# Patient Record
Sex: Male | Born: 1960 | Race: Black or African American | Hispanic: No | Marital: Single | Smoking: Current every day smoker
Health system: Southern US, Community
[De-identification: ages and names within clinical notes are randomized; demographics above are authoritative.]

## PROBLEM LIST (undated history)

## (undated) DIAGNOSIS — E669 Obesity, unspecified: Secondary | ICD-10-CM

## (undated) DIAGNOSIS — F200 Paranoid schizophrenia: Secondary | ICD-10-CM

## (undated) DIAGNOSIS — I639 Cerebral infarction, unspecified: Secondary | ICD-10-CM

## (undated) DIAGNOSIS — I10 Essential (primary) hypertension: Secondary | ICD-10-CM

## (undated) DIAGNOSIS — E66811 Obesity, class 1: Secondary | ICD-10-CM

## (undated) DIAGNOSIS — K5909 Other constipation: Secondary | ICD-10-CM

## (undated) HISTORY — DX: Essential (primary) hypertension: I10

## (undated) HISTORY — DX: Other constipation: K59.09

## (undated) HISTORY — DX: Obesity, class 1: E66.811

## (undated) HISTORY — DX: Obesity, unspecified: E66.9

---

## 2000-05-06 ENCOUNTER — Encounter: Payer: Self-pay | Admitting: Emergency Medicine

## 2000-05-06 ENCOUNTER — Emergency Department (HOSPITAL_COMMUNITY): Admission: EM | Admit: 2000-05-06 | Discharge: 2000-05-06 | Payer: Self-pay | Admitting: Emergency Medicine

## 2012-12-10 ENCOUNTER — Encounter (HOSPITAL_COMMUNITY): Payer: Self-pay | Admitting: Emergency Medicine

## 2012-12-10 ENCOUNTER — Emergency Department (HOSPITAL_COMMUNITY)
Admission: EM | Admit: 2012-12-10 | Discharge: 2012-12-11 | Disposition: A | Discharge status: Home / Self Care | Payer: Medicaid Other | Attending: Emergency Medicine | Admitting: Emergency Medicine

## 2012-12-10 DIAGNOSIS — Z008 Encounter for other general examination: Secondary | ICD-10-CM | POA: Diagnosis present

## 2012-12-10 DIAGNOSIS — Z79899 Other long term (current) drug therapy: Secondary | ICD-10-CM | POA: Diagnosis not present

## 2012-12-10 DIAGNOSIS — F17200 Nicotine dependence, unspecified, uncomplicated: Secondary | ICD-10-CM | POA: Insufficient documentation

## 2012-12-10 DIAGNOSIS — F29 Unspecified psychosis not due to a substance or known physiological condition: Secondary | ICD-10-CM | POA: Insufficient documentation

## 2012-12-10 DIAGNOSIS — F200 Paranoid schizophrenia: Secondary | ICD-10-CM | POA: Insufficient documentation

## 2012-12-10 HISTORY — DX: Paranoid schizophrenia: F20.0

## 2012-12-10 LAB — CBC
HCT: 38.9 % — ABNORMAL LOW (ref 39.0–52.0)
Hemoglobin: 12.8 g/dL — ABNORMAL LOW (ref 13.0–17.0)
MCH: 30.3 pg (ref 26.0–34.0)
MCV: 92.2 fL (ref 78.0–100.0)
RBC: 4.22 MIL/uL (ref 4.22–5.81)

## 2012-12-10 LAB — COMPREHENSIVE METABOLIC PANEL
ALT: 15 U/L (ref 0–53)
CO2: 26 mEq/L (ref 19–32)
Calcium: 8.7 mg/dL (ref 8.4–10.5)
Creatinine, Ser: 0.67 mg/dL (ref 0.50–1.35)
GFR calc Af Amer: >90 mL/min (ref >90)
GFR calc non Af Amer: >90 mL/min (ref >90)
Glucose, Bld: 106 mg/dL — ABNORMAL HIGH (ref 70–99)

## 2012-12-10 LAB — RAPID URINE DRUG SCREEN, HOSP PERFORMED
Opiates: NOT DETECTED
Tetrahydrocannabinol: NOT DETECTED

## 2012-12-10 MED ORDER — LORAZEPAM 1 MG PO TABS: 1.0000 mg | ORAL_TABLET | Freq: Three times a day (TID) | ORAL | Status: DC | PRN

## 2012-12-10 MED ORDER — ONDANSETRON HCL 4 MG PO TABS: 4.0000 mg | ORAL_TABLET | Freq: Three times a day (TID) | ORAL | Status: DC | PRN

## 2012-12-10 MED ORDER — ACETAMINOPHEN 325 MG PO TABS
650.0000 mg | ORAL_TABLET | ORAL | Status: DC | PRN
  Administered 2012-12-10: 650 mg via ORAL
  Filled 2012-12-10: qty 2

## 2012-12-10 MED ORDER — NICOTINE 21 MG/24HR TD PT24: 21.0000 mg | MEDICATED_PATCH | Freq: Every day | TRANSDERMAL | Status: DC | PRN

## 2012-12-10 MED ORDER — ALUM & MAG HYDROXIDE-SIMETH 200-200-20 MG/5ML PO SUSP: 30.0000 mL | ORAL | Status: DC | PRN

## 2012-12-10 NOTE — ED Notes (Signed)
Patient searched by security.

## 2012-12-10 NOTE — ED Notes (Signed)
Erling Conte sister [phone]; Kailen Hinkle mother [phone]

## 2012-12-10 NOTE — ED Provider Notes (Signed)
History     CSN: [id number]  Arrival date & time 12/10/12  1729   First MD Initiated Contact with Patient 12/10/12 1735      Chief Complaint  Patient presents with  . Medical Clearance    The history is provided by the police and medical records. The history is limited by the condition of the patient (Hx schizophrenia).  Pt was seen at 1755.  Per Police and [employer] report, pt with hx paranoid schizophrenia.  Pt brought to ED today under IVC by his family for increasing aggression and non-compliant with medications.     Past Medical History  Diagnosis Date  . Paranoid schizophrenia     History reviewed. No pertinent past surgical history.    History  Substance Use Topics  . Smoking status: Current Every Day Smoker    Types: Cigarettes  . Smokeless tobacco: Not on file  . Alcohol Use: No      Review of Systems  Unable to perform ROS: Psychiatric disorder    Allergies  Review of patient's allergies indicates no known allergies.  Home Medications   Current Outpatient Rx  Name  Route  Sig  Dispense  Refill  . acetaminophen (TYLENOL) 325 MG tablet   Oral   Take 650 mg by mouth every 6 (six) hours as needed for pain (pain).         . ARIPiprazole (ABILIFY) 10 MG tablet   Oral   Take 10 mg by mouth at bedtime.           BP 146/86  Pulse 103  Temp(Src) 98.2 F (36.8 C) (Oral)  Resp 16  SpO2 98%  Physical Exam 1800: Physical examination:  Nursing notes reviewed; Vital signs and O2 SAT reviewed;  Constitutional: Well developed, Well nourished, Well hydrated, In no acute distress; Head:  Normocephalic, atraumatic; Eyes: EOMI, PERRL, No scleral icterus; ENMT: Mouth and pharynx normal, Mucous membranes moist; Neck: Supple, Full range of motion, No lymphadenopathy; Cardiovascular: Regular rate and rhythm, No gallop; Respiratory: Breath sounds clear & equal bilaterally, No wheezes.  Speaking full sentences with ease, Normal respiratory effort/excursion; Chest:  Nontender, Movement normal; Abdomen: Soft, Nontender, Nondistended, Normal bowel sounds;; Extremities: Pulses normal, No tenderness, No edema, No calf edema or asymmetry.; Neuro: Awake, alert, mumbling speech. Moves ext spontaneously on stretcher; Skin: Color normal, Warm, Dry.; Psych:  Affect flat, poor eye contact, +psychosis.     ED Course  Procedures    MDM  MDM Reviewed: previous chart, vitals and nursing note Interpretation: labs     Results for orders placed during the hospital encounter of 12/10/12  ACETAMINOPHEN LEVEL      Result Value Range   Acetaminophen (Tylenol), Serum <15.0  10 - 30 ug/mL  CBC      Result Value Range   WBC 10.0  4.0 - 10.5 K/uL   RBC 4.22  4.22 - 5.81 MIL/uL   Hemoglobin 12.8 (*) 13.0 - 17.0 g/dL   HCT 78.2 (*) 95.6 - 21.3 %   MCV 92.2  78.0 - 100.0 fL   MCH 30.3  26.0 - 34.0 pg   MCHC 32.9  30.0 - 36.0 g/dL   RDW 08.6  57.8 - 46.9 %   Platelets 271  150 - 400 K/uL  COMPREHENSIVE METABOLIC PANEL      Result Value Range   Sodium 136  135 - 145 mEq/L   Potassium 4.0  3.5 - 5.1 mEq/L   Chloride 101  96 - 112 mEq/L  CO2 26  19 - 32 mEq/L   Glucose, Bld 106 (*) 70 - 99 mg/dL   BUN 8  6 - 23 mg/dL   Creatinine, Ser 4.54  0.50 - 1.35 mg/dL   Calcium 8.7  8.4 - 09.8 mg/dL   Total Protein 6.8  6.0 - 8.3 g/dL   Albumin 3.3 (*) 3.5 - 5.2 g/dL   AST 16  0 - 37 U/L   ALT 15  0 - 53 U/L   Alkaline Phosphatase 137 (*) 39 - 117 U/L   Total Bilirubin 0.3  0.3 - 1.2 mg/dL   GFR calc non Af Amer >90  >90 mL/min   GFR calc Af Amer >90  >90 mL/min  ETHANOL      Result Value Range   Alcohol, Ethyl (B) <11  0 - 11 mg/dL  SALICYLATE LEVEL      Result Value Range   Salicylate Lvl <2.0 (*) 2.8 - 20.0 mg/dL  URINE RAPID DRUG SCREEN (HOSP PERFORMED)      Result Value Range   Opiates NONE DETECTED  NONE DETECTED   Cocaine NONE DETECTED  NONE DETECTED   Benzodiazepines NONE DETECTED  NONE DETECTED   Amphetamines NONE DETECTED  NONE DETECTED    Tetrahydrocannabinol NONE DETECTED  NONE DETECTED   Barbiturates NONE DETECTED  NONE DETECTED     2215:  Pending Telepsych eval.         Laray Anger, DO 12/10/12 2220

## 2012-12-10 NOTE — ED Notes (Signed)
Patient sent from [facility]- h/o schizophrenia that is non compliant with meds and verbally aggressive with sister.

## 2012-12-11 DIAGNOSIS — F200 Paranoid schizophrenia: Secondary | ICD-10-CM

## 2012-12-11 MED ORDER — ARIPIPRAZOLE 9.75 MG/1.3ML IM SOLN
9.7500 mg | Freq: Once | INTRAMUSCULAR | Status: AC
  Administered 2012-12-11: 9.75 mg via INTRAMUSCULAR
  Filled 2012-12-11: qty 1.3

## 2012-12-11 NOTE — Progress Notes (Signed)
CSW left message with Erling Conte who is pt sister. Per act team, pt sister is pt guardian.   Doree Albee  [phone] 12/11/2012 11:27am

## 2012-12-11 NOTE — ED Notes (Signed)
Barbara Cower and [place] at bedside in attempt to walk pt. Pt unable to walk at this time. States that he usually uses a wheelchair to get around. Reports that he has nerve damage.

## 2012-12-11 NOTE — Consult Note (Signed)
Reason for Consult: Paranoid schizophrenia noncompliant with medications and threatening his sister Referring Physician: Dr. Carney Corners is an 52 y.o. male.  HPI: The patient was brought in to the best the long emergency department by [employer] with involuntary commitment filed by [facility] for psychiatric evaluation as patient has increased agitation noncompliant with medication. Patient reported he has spasms and pains on his neck which making him upset. Patient reported he like to take medication which was prescribed in the prison with yeast was stated 2005 to February of this year for violation of probation and legal charges. Patient denied symptoms of depression, anxiety, Mania and psychosis. Patient denied suicidal and homicidal ideation intentions or plans.  MSE: Patient was found sitting on his bed, calm, quite and cooperative. Patient has no abnormal psychomotor activity. He states his mood is fine his affect was constricted he has normal speech but difficult to follow because of lost upper teeth. Patient has a linear and goal-directed thought process he does not appear to be interacting with internal stimuli. He has fair to poor insight judgment and impulse components.  Past Medical History  Diagnosis Date  . Paranoid schizophrenia     History reviewed. No pertinent past surgical history.  History reviewed. No pertinent family history.  Social History:  reports that he has been smoking Cigarettes.  He has been smoking about 0.00 packs per day. He does not have any smokeless tobacco history on file. He reports that he does not drink alcohol or use illicit drugs.  Allergies: No Known Allergies  Medications: I have reviewed the patient's current medications.  Results for orders placed during the hospital encounter of 12/10/12 (from the past 48 hour(s))  ACETAMINOPHEN LEVEL     Status: None   Collection Time    12/10/12  5:57 PM      Result Value Range   Acetaminophen (Tylenol), Serum <15.0  10 - 30 ug/mL   Comment:            THERAPEUTIC CONCENTRATIONS VARY     SIGNIFICANTLY. A RANGE OF 10-30     ug/mL MAY BE AN EFFECTIVE     CONCENTRATION FOR MANY PATIENTS.     HOWEVER, SOME ARE BEST TREATED     AT CONCENTRATIONS OUTSIDE THIS     RANGE.     ACETAMINOPHEN CONCENTRATIONS     >150 ug/mL AT 4 HOURS AFTER     INGESTION AND >50 ug/mL AT 12     HOURS AFTER INGESTION ARE     OFTEN ASSOCIATED WITH TOXIC     REACTIONS.  CBC     Status: Abnormal   Collection Time    12/10/12  5:57 PM      Result Value Range   WBC 10.0  4.0 - 10.5 K/uL   RBC 4.22  4.22 - 5.81 MIL/uL   Hemoglobin 12.8 (*) 13.0 - 17.0 g/dL   HCT 46.9 (*) 62.9 - 52.8 %   MCV 92.2  78.0 - 100.0 fL   MCH 30.3  26.0 - 34.0 pg   MCHC 32.9  30.0 - 36.0 g/dL   RDW 41.3  24.4 - 01.0 %   Platelets 271  150 - 400 K/uL  COMPREHENSIVE METABOLIC PANEL     Status: Abnormal   Collection Time    12/10/12  5:57 PM      Result Value Range   Sodium 136  135 - 145 mEq/L   Potassium 4.0  3.5 - 5.1 mEq/L  Chloride 101  96 - 112 mEq/L   CO2 26  19 - 32 mEq/L   Glucose, Bld 106 (*) 70 - 99 mg/dL   BUN 8  6 - 23 mg/dL   Creatinine, Ser 4.09  0.50 - 1.35 mg/dL   Calcium 8.7  8.4 - 81.1 mg/dL   Total Protein 6.8  6.0 - 8.3 g/dL   Albumin 3.3 (*) 3.5 - 5.2 g/dL   AST 16  0 - 37 U/L   ALT 15  0 - 53 U/L   Alkaline Phosphatase 137 (*) 39 - 117 U/L   Total Bilirubin 0.3  0.3 - 1.2 mg/dL   GFR calc non Af Amer >90  >90 mL/min   GFR calc Af Amer >90  >90 mL/min   Comment:            The eGFR has been calculated     using the CKD EPI equation.     This calculation has not been     validated in all clinical     situations.     eGFR's persistently     <90 mL/min signify     possible Chronic Kidney Disease.  ETHANOL     Status: None   Collection Time    12/10/12  5:57 PM      Result Value Range   Alcohol, Ethyl (B) <11  0 - 11 mg/dL   Comment:            LOWEST DETECTABLE LIMIT FOR      SERUM ALCOHOL IS 11 mg/dL     FOR MEDICAL PURPOSES ONLY  SALICYLATE LEVEL     Status: Abnormal   Collection Time    12/10/12  5:57 PM      Result Value Range   Salicylate Lvl <2.0 (*) 2.8 - 20.0 mg/dL  URINE RAPID DRUG SCREEN (HOSP PERFORMED)     Status: None   Collection Time    12/10/12  6:48 PM      Result Value Range   Opiates NONE DETECTED  NONE DETECTED   Cocaine NONE DETECTED  NONE DETECTED   Benzodiazepines NONE DETECTED  NONE DETECTED   Amphetamines NONE DETECTED  NONE DETECTED   Tetrahydrocannabinol NONE DETECTED  NONE DETECTED   Barbiturates NONE DETECTED  NONE DETECTED   Comment:            DRUG SCREEN FOR MEDICAL PURPOSES     ONLY.  IF CONFIRMATION IS NEEDED     FOR ANY PURPOSE, NOTIFY LAB     WITHIN 5 DAYS.                LOWEST DETECTABLE LIMITS     FOR URINE DRUG SCREEN     Drug Class       Cutoff (ng/mL)     Amphetamine      1000     Barbiturate      200     Benzodiazepine   200     Tricyclics       300     Opiates          300     Cocaine          300     THC              50    No results found.  Positive for aggressive behavior, bad mood, behavior problems and mood swings Blood pressure 144/87, pulse 62, temperature 98.8 F (37.1 C), temperature  source Oral, resp. rate 16, SpO2 97.00%.   Assessment/Plan: Paranoid schizophrenia, chronic Partially compliant with medication Antisocial personality disorder by history  Recommendation: Patient will be referred to the outpatient psychiatric services at [facility] behavioral. No medication changes made during this visit.  Jayce Kainz,JANARDHAHA R. 12/11/2012, 2:04 PM

## 2012-12-11 NOTE — ED Notes (Signed)
Psych MD has seen and spoken with the pt since his tx to TCU.

## 2012-12-11 NOTE — BH Assessment (Signed)
[facility] Assessment Progress Note   Patient to be discharged home per recommendations of Dr. Elsie Saas. Writer met with patients sister aka caregiver. She was agreeable to patients discharge, however; wanted to insure that patients scheduled medications were given. Writer discussed sisters concerns with Dr. Elsie Saas. He will order the appropriate medications for patient which will be administered prior to patients discharge from [facility]. Patiens sister made aware of the discharge plan and is agreeable to taking patient home with her once officially discharged.

## 2012-12-11 NOTE — Progress Notes (Signed)
ED CM consulted for medication assistance for abilify. CM reviewed EPIC notes and chart review information CM spoke with the pt and sister, cheryl about [facility] MATCH program ($3 co pay for each Rx through [facility] program and choice of pharmacies) Pt agreed to receive assistance from program   CM spoke with pt who confirms self pay [facility] resident with no pcp.  Pt voiced understanding and appreciation of resources provided  Pt is eligible for [facility] MATCH program. PDMI information entered. MATCH letter completed and provided to pt. CM updated EDP and ED RN

## 2012-12-11 NOTE — Progress Notes (Signed)
[facility] ED CM consulted by ED SW. Need for contact with sister to inquire about medical information for 5 years in jail, why pt w/c bound and list of medications.  [name] contacted at [phone] and she informed CM she has copies of pt medical history and will bring them to [facility] ED within 30 minutes.  She reports pt was at [facility] state prison and her mother was contacted during that time about the pt having cervical spondylosis and degenerative spinal disorder with the need for surgery consisting of "removing a bone out of his knee" The refused to have the surgery and therefore due to his back issues he became w/c bound.  ED CM updated TCU RN so that when sister arrives the pmh information from jail could be copied and entered in pt [facility] medical history for filing
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
## 2012-12-11 NOTE — Progress Notes (Signed)
Name: Jared Preston, Jared Preston [facility][id number] Bin: [id number] RX Group: [id number] PCN: [id number]  Discharge Date: December 11, 2012 Expiration Date: December 18, 2012 (must be filled within 7 days of discharge   Dear ______David L Tate____________:  Bonita Quin have been approved to have your discharge prescriptions filled through our [facility] (Medication Assistance Through [facility]) program. This program allows for a one-time (no refills) 34-day supply of selected medications for a low copay amount.  The copay is $3.00 per prescription. For instance, if you have one prescription, you will pay $3.00; for two prescriptions, you pay $6.00; for three prescriptions, you pay $9.00; and so on.  Only certain pharmacies are participating in this program with [facility]. You will need to select one of the pharmacies from the attached list and take your prescriptions, this letter, and your photo ID to one of the participating pharmacies.   We are excited that you are able to use the [facility] program to get your medications. These prescriptions must be filled within 7 days of hospital discharge or they will no longer be valid for the [facility] program. Should you have any problems with your prescriptions please contact your case management team member at [phone].  Thank you,   [facility]   Participating [facility]  [facility]   [facility] [address] [place], [place]   [facility] [address] [place], [place]   [facility] [address] [place], [place]   [facility]   [address] [place], [place]   [address] [place], [place], [place]   [address] [place], [place]   [address], [place], [place]   [address], [place], [place]   [address], [place], [place]   [address] [place], [place]   [address] [place], [place]   [address], [place], [place]   [address] [place], [place], [place]  [facility]   [address], [place], [place]   [address] [place], [place]   [address] [place], [place], [place]   [address], [place], [place]   [address], [place], [place]   [address] [place] [address] [place], [place], [place] [facility]   [address] [place], [place]   [address], [place], [place]   [address], [place], [place]   [address], [place], [place]   [address], [place], [place]   [address] [place], [place]   [address], [place], [place]   [address], [place], [place]   [address], [place], [place]   [address], [place], [place]   [address], [place], [place]   [address], [place], [place]   [address] [place], [place]   [address] [place], [place], [place]  Independent Pharmacies   [facility] [address] [place], [address] [place], [place]   [facility] [address] [place], [place]   [facility] [address] [place], [place]   For continued medication needs, please contact the [facility] Department at  [phone].

## 2012-12-11 NOTE — ED Provider Notes (Signed)
Telepsych Dr. Jacky Kindle has evaluated pt:  States pt is grossly psychotic, noncompliant with medications, and needs constant redirection throughout evaluation; he is displaying poor judgment and insight; recommends psych admit for stabilization at this time.    Laray Anger, DO 12/11/12 [phone]

## 2012-12-11 NOTE — [facility] Suicide Risk Assessment (Signed)
Suicide Risk Assessment  Discharge Assessment     Demographic Factors:  Male, Adolescent or young adult and Low socioeconomic status  Mental Status Per Nursing Assessment::   On Admission:     Current Mental Status by Physician: NA  Loss Factors: Decline in physical health and Financial problems/change in socioeconomic status  Historical Factors: Impulsivity  Risk Reduction Factors:   Sense of responsibility to family, Religious beliefs about death, Living with another person, especially a relative, Positive social support and Positive therapeutic relationship  Continued Clinical Symptoms:  Severe Anxiety and/or Agitation Schizophrenia:   Paranoid or undifferentiated type Previous Psychiatric Diagnoses and Treatments Medical Diagnoses and Treatments/Surgeries  Cognitive Features That Contribute To Risk:  Polarized thinking    Suicide Risk:  Minimal: No identifiable suicidal ideation.  Patients presenting with no risk factors but with morbid ruminations; may be classified as minimal risk based on the severity of the depressive symptoms  Discharge Diagnoses:   AXIS I:  Schizoaffective Disorder AXIS II:  Antisocial Personality Disorder AXIS III:   Past Medical History  Diagnosis Date  . Paranoid schizophrenia    AXIS IV:  economic problems, educational problems, problems related to legal system/crime and problems with access to health care services AXIS V:  41-50 serious symptoms  Plan Of Care/Follow-up recommendations:  Activity:  as tolerated Diet:  regular  Is patient on multiple antipsychotic therapies at discharge:  No   Has Patient had three or more failed trials of antipsychotic monotherapy by history:  No  Recommended Plan for Multiple Antipsychotic Therapies: Not applicable  Carlethia Mesquita,JANARDHAHA R. 12/11/2012, 2:13 PM

## 2012-12-11 NOTE — Progress Notes (Signed)
[facility] ED CM reviewed forms brought in by sister, cheryl that are personal care services form dated 10/17/12 from dr mark p.cheltenham[facility] psychiatrist [phone] fax [phone]. The form indicated pt's d/c date from prison was on 10/21/12. [name] states pt "sat in wheelchair in prison a lot with his leg crossed"    PMH listed on this form are: paranoid schizphrenia, antisocial personality disorder, tardive dyskinesia, cervical radiculopathy, cervical spondylosis.  [name] reports pt was only on abilify 10 mg and cogentin in prison but was seen by [facility] on 12/10/12 to get a shot of abilify and was informed pt did not need to be on cogentin any longer. [name] reports pt has not had any therapy at all and she believes he may benefit from therapy   ED psychiatrist update and allowed to review forms prior to placing it in his medical record for filing. Notified him that cheryl sister is available in pt's room for any further questions. Psychiatrist notified of Cheryl's inquiry about pt receiving a shot of abilify (due to non compliance with taking it po).   [name] inquired about a home ramp for pt [name] reports pt has [facility] providing services since last week.  [name] was quoted $1200 Reports pt is connected an adult day care service called [employer],[address]. [address] [place], [place] [address] [phone] Office [phone] Fax and pcp is at [facility] community & homeless & walk-ins) [address], [place], [place] [address] Phone: [phone] Fax: [phone] EPIC updated  1400 CM spoke with Baxter Hire, home health coordinator of [facility] [phone] to discussed assistance with handicap ramp Baxter Hire will check and return a call to CM.   CM left a voice message for Lucretia DME coordinator of [facility] [phone]) about handicap ramp Spoke with Darral Dash who will check with updated  services offered and return a call to CM received a call back from [place] stating that [facility] does assist with handicap ramp at the following size and costs:  5 foot ramp that covers up to 2 steps at $395 7 foot ramp that covers 2 or more steps at $540 or A charge of $45 a month for rental  The pt/family needs to go to the [facility] [address] [place], [place], [address].  [phone] office to obtain ramps    This information provided to sister cheryl in written form at 1420

## 2012-12-11 NOTE — ED Provider Notes (Signed)
Filed Vitals:   12/11/12 0550  BP: 133/81  Pulse: 61  Temp: 98.6 F (37 C)  Resp: 18   Pt seen this morning. Sleeping but awakens to voice. Mildly agitated but following commands. Pending placement per psych recommendations.   Raeford Razor, MD 12/11/12 [phone]

## 2013-02-13 ENCOUNTER — Ambulatory Visit: Payer: Medicaid Other | Admitting: Physical Therapy

## 2014-03-31 ENCOUNTER — Encounter (HOSPITAL_COMMUNITY): Payer: Self-pay | Admitting: Emergency Medicine

## 2014-03-31 ENCOUNTER — Emergency Department (HOSPITAL_COMMUNITY): Payer: Medicaid Other

## 2014-03-31 ENCOUNTER — Emergency Department (HOSPITAL_COMMUNITY)
Admission: EM | Admit: 2014-03-31 | Discharge: 2014-03-31 | Disposition: A | Discharge status: Home / Self Care | Payer: Medicaid Other | Attending: Emergency Medicine | Admitting: Emergency Medicine

## 2014-03-31 DIAGNOSIS — K5900 Constipation, unspecified: Secondary | ICD-10-CM | POA: Diagnosis not present

## 2014-03-31 DIAGNOSIS — F17200 Nicotine dependence, unspecified, uncomplicated: Secondary | ICD-10-CM | POA: Insufficient documentation

## 2014-03-31 DIAGNOSIS — K089 Disorder of teeth and supporting structures, unspecified: Secondary | ICD-10-CM | POA: Diagnosis not present

## 2014-03-31 DIAGNOSIS — E669 Obesity, unspecified: Secondary | ICD-10-CM | POA: Diagnosis not present

## 2014-03-31 DIAGNOSIS — Z8659 Personal history of other mental and behavioral disorders: Secondary | ICD-10-CM | POA: Diagnosis not present

## 2014-03-31 DIAGNOSIS — Z8673 Personal history of transient ischemic attack (TIA), and cerebral infarction without residual deficits: Secondary | ICD-10-CM | POA: Diagnosis not present

## 2014-03-31 DIAGNOSIS — K0889 Other specified disorders of teeth and supporting structures: Secondary | ICD-10-CM

## 2014-03-31 HISTORY — DX: Cerebral infarction, unspecified: I63.9

## 2014-03-31 LAB — URINALYSIS, ROUTINE W REFLEX MICROSCOPIC
BILIRUBIN URINE: NEGATIVE
GLUCOSE, UA: NEGATIVE mg/dL
Hgb urine dipstick: NEGATIVE
KETONES UR: NEGATIVE mg/dL
Nitrite: NEGATIVE
PH: 7.5 (ref 5.0–8.0)
PROTEIN: NEGATIVE mg/dL
Specific Gravity, Urine: 1.009 (ref 1.005–1.030)
Urobilinogen, UA: 1 mg/dL (ref 0.0–1.0)

## 2014-03-31 LAB — URINE MICROSCOPIC-ADD ON

## 2014-03-31 MED ORDER — OXYCODONE-ACETAMINOPHEN 5-325 MG PO TABS: 2.0000 | ORAL_TABLET | Freq: Four times a day (QID) | ORAL | Status: DC | PRN

## 2014-03-31 MED ORDER — PENICILLIN V POTASSIUM 500 MG PO TABS: 500.0000 mg | ORAL_TABLET | Freq: Four times a day (QID) | ORAL | Status: AC

## 2014-03-31 NOTE — Discharge Instructions (Signed)
Constipation °Constipation is when a person has fewer than three bowel movements a week, has difficulty having a bowel movement, or has stools that are dry, hard, or larger than normal. As people grow older, constipation is more common. If you try to fix constipation with medicines that make you have a bowel movement (laxatives), the problem may get worse. Long-term laxative use may cause the muscles of the colon to become weak. A low-fiber diet, not taking in enough fluids, and taking certain medicines may make constipation worse.  °CAUSES  °· Certain medicines, such as antidepressants, pain medicine, iron supplements, antacids, and water pills.   °· Certain diseases, such as diabetes, irritable bowel syndrome (IBS), thyroid disease, or depression.   °· Not drinking enough water.   °· Not eating enough fiber-rich foods.   °· Stress or travel.   °· Lack of physical activity or exercise.   °· Ignoring the urge to have a bowel movement.   °· Using laxatives too much.   °SIGNS AND SYMPTOMS  °· Having fewer than three bowel movements a week.   °· Straining to have a bowel movement.   °· Having stools that are hard, dry, or larger than normal.   °· Feeling full or bloated.   °· Pain in the lower abdomen.   °· Not feeling relief after having a bowel movement.   °DIAGNOSIS  °Your health care provider will take a medical history and perform a physical exam. Further testing may be done for severe constipation. Some tests may include: °· A barium enema X-ray to examine your rectum, colon, and, sometimes, your small intestine.   °· A sigmoidoscopy to examine your lower colon.   °· A colonoscopy to examine your entire colon. °TREATMENT  °Treatment will depend on the severity of your constipation and what is causing it. Some dietary treatments include drinking more fluids and eating more fiber-rich foods. Lifestyle treatments may include regular exercise. If these diet and lifestyle recommendations do not help, your health care  provider may recommend taking over-the-counter laxative medicines to help you have bowel movements. Prescription medicines may be prescribed if over-the-counter medicines do not work.  °HOME CARE INSTRUCTIONS  °· Eat foods that have a lot of fiber, such as fruits, vegetables, whole grains, and beans. °· Limit foods high in fat and processed sugars, such as french fries, hamburgers, cookies, candies, and soda.   °· A fiber supplement may be added to your diet if you cannot get enough fiber from foods.   °· Drink enough fluids to keep your urine clear or pale yellow.   °· Exercise regularly or as directed by your health care provider.   °· Go to the restroom when you have the urge to go. Do not hold it.   °· Only take over-the-counter or prescription medicines as directed by your health care provider. Do not take other medicines for constipation without talking to your health care provider first.   °SEEK IMMEDIATE MEDICAL CARE IF:  °· You have bright red blood in your stool.   °· Your constipation lasts for more than 4 days or gets worse.   °· You have abdominal or rectal pain.   °· You have thin, pencil-like stools.   °· You have unexplained weight loss. °MAKE SURE YOU:  °· Understand these instructions. °· Will watch your condition. °· Will get help right away if you are not doing well or get worse. °Document Released: 05/13/2004 Document Revised: 08/20/2013 Document Reviewed: 05/27/2013 °ExitCare® Patient Information ©2015 [employer]. This information is not intended to replace advice given to you by your health care provider. Make sure you discuss any questions   you have with your health care provider.   Start using Dulcolax tablets twice daily as well as 17 g of MiraLAX powder in a glass full of water twice daily as well. He may also purchase an over-the-counter enema to use.

## 2014-03-31 NOTE — ED Notes (Signed)
Pt here for constipation x 5 days, pt denies any other symptoms,deniespain,

## 2014-03-31 NOTE — ED Notes (Signed)
Erling Conteheryl Ruiter, [phone]

## 2014-03-31 NOTE — ED Provider Notes (Signed)
CSN: [id number]     Arrival date & time 03/31/14  1033 History   First MD Initiated Contact with Patient 03/31/14 1121     Chief Complaint  Patient presents with  . Constipation     (Consider location/radiation/quality/duration/timing/severity/associated sxs/prior Treatment) Patient is a 53 y.o. male presenting with constipation. The history is provided by the patient.  Constipation Severity:  Mild Time since last bowel movement:  10 days Timing:  Constant Progression:  Unchanged Chronicity:  Recurrent Context comment:  Spontaneous Stool description:  None produced Unusual stool frequency:  Irregular Relieved by:  Nothing Worsened by:  Nothing tried Ineffective treatments: senokot. Associated symptoms: no abdominal pain, no diarrhea, no dysuria, no fever, no nausea and no vomiting   Risk factors: obesity   Risk factors: no hx of abdominal surgery     Past Medical History  Diagnosis Date  . Paranoid schizophrenia   . Stroke    History reviewed. No pertinent past surgical history. History reviewed. No pertinent family history. History  Substance Use Topics  . Smoking status: Current Every Day Smoker    Types: Cigarettes  . Smokeless tobacco: Not on file  . Alcohol Use: No    Review of Systems  Constitutional: Negative for fever.  HENT: Positive for dental problem. Negative for drooling and rhinorrhea.   Eyes: Negative for pain.  Respiratory: Negative for cough and shortness of breath.   Cardiovascular: Negative for chest pain and leg swelling.  Gastrointestinal: Positive for constipation. Negative for nausea, vomiting, abdominal pain and diarrhea.  Genitourinary: Negative for dysuria and hematuria.  Musculoskeletal: Negative for gait problem and neck pain.  Skin: Negative for color change.  Neurological: Negative for numbness and headaches.  Hematological: Negative for adenopathy.  Psychiatric/Behavioral: Negative for behavioral problems.  All other systems  reviewed and are negative.     Allergies  Review of patient's allergies indicates no known allergies.  Home Medications   Prior to Admission medications   Medication Sig Start Date End Date Taking? Authorizing Provider  acetaminophen (TYLENOL) 325 MG tablet Take 650 mg by mouth every 6 (six) hours as needed for pain (pain).    Historical Provider, MD   BP 145/87  Pulse 73  Temp(Src) 98 F (36.7 C) (Oral)  Resp 18  Ht 6\' 2"  (1.88 m)  Wt 268 lb (121.564 kg)  BMI 34.39 kg/m2  SpO2 98% Physical Exam  Nursing note and vitals reviewed. Constitutional: He is oriented to person, place, and time. He appears well-developed and well-nourished.  HENT:  Head: Normocephalic and atraumatic.  Right Ear: External ear normal.  Left Ear: External ear normal.  Nose: Nose normal.  Mouth/Throat: Oropharynx is clear and moist. No oropharyngeal exudate.  Diffuse poor dentition with multiple old fractured teeth. He has tenderness to palpation of a left upper posterior molar.  No trismus is noted.  No intraoral abscess is noted.  Normal appearing posterior oropharynx.  Mild swelling to the left cheek.  Eyes: Conjunctivae and EOM are normal. Pupils are equal, round, and reactive to light.  Neck: Normal range of motion. Neck supple.  Cardiovascular: Normal rate, regular rhythm, normal heart sounds and intact distal pulses.  Exam reveals no gallop and no friction rub.   No murmur heard. Pulmonary/Chest: Effort normal and breath sounds normal. No respiratory distress. He has no wheezes.  Abdominal: Soft. Bowel sounds are normal. He exhibits no distension. There is no tenderness. There is no rebound and no guarding.  Musculoskeletal: Normal range of motion. He exhibits  no edema and no tenderness.  Neurological: He is alert and oriented to person, place, and time.  Skin: Skin is warm and dry.  Psychiatric: He has a normal mood and affect. His behavior is normal.    ED Course  Procedures  (including critical care time) Labs Review Labs Reviewed  URINALYSIS, ROUTINE W REFLEX MICROSCOPIC - Abnormal; Notable for the following:    APPearance HAZY (*)    Leukocytes, UA TRACE (*)    All other components within normal limits  URINE MICROSCOPIC-ADD ON - Abnormal; Notable for the following:    Squamous Epithelial / LPF FEW (*)    Bacteria, UA FEW (*)    All other components within normal limits    Imaging Review Dg Abd 2 Views  03/31/2014   CLINICAL DATA:  Constipation.  Abdominal pain.  EXAM: ABDOMEN - 2 VIEW  COMPARISON:  None.  FINDINGS: Stool left colon and rectosigmoid region. Gas and stool-filled right colon. Gas-filled prominent size small bowel loops right lower quadrant.  No free intraperitoneal air.  Degenerative changes L4-5 and left hip joint.  IMPRESSION: Nonspecific abnormal bowel gas pattern as detailed above.   Electronically Signed   By: Bridgett LarssonSteve  Olson M.D.   On: 03/31/2014 12:32     EKG Interpretation None      MDM   Final diagnoses:  Constipation, unspecified constipation type  Pain, dental    11:49 AM 53 y.o. male who presents with constipation for 10 days. He notes that he has a history of constipation and takes Senokot. He denies any abdominal pain, fevers, vomiting. He states that he also started a new medication last Wednesday but only took it for 2 days and is not sure what this medication is. He states that he developed some left-sided facial swelling and left upper dental pain 4 days ago. He has diffuse dental caries and multiple fractured teeth. No obvious intraoral abscess is noted. He is afebrile and vital signs are unremarkable here. Will get screening plain film of abdomen.  1:59 PM: Will give pain control/abx for dental pain. Will rec miralax bid and senokot bid as well as otc enema for constipation.  I have discussed the diagnosis/risks/treatment options with the patient and believe the pt to be eligible for discharge home to follow-up with his  dentist asap. We also discussed returning to the ED immediately if new or worsening sx occur. We discussed the sx which are most concerning (e.g., abd pain, fever, worsening facial swelling) that necessitate immediate return. Medications administered to the patient during their visit and any new prescriptions provided to the patient are listed below.  Medications given during this visit Medications - No data to display  Discharge Medication List as of 03/31/2014  2:01 PM    START taking these medications   Details  oxyCODONE-acetaminophen (PERCOCET) 5-325 MG per tablet Take 2 tablets by mouth every 6 (six) hours as needed for moderate pain., Starting 03/31/2014, Until Discontinued, Print    penicillin v potassium (VEETID) 500 MG tablet Take 1 tablet (500 mg total) by mouth 4 (four) times daily., Starting 03/31/2014, Last dose on Mon 04/07/14, Print         Junius ArgyleForrest S Sahan Pen, MD 04/01/14 1321

## 2014-09-01 ENCOUNTER — Encounter: Payer: Medicaid Other | Attending: Internal Medicine | Admitting: Dietician

## 2014-09-01 ENCOUNTER — Encounter: Payer: Self-pay | Admitting: Dietician

## 2014-09-01 VITALS — Ht 74.0 in

## 2014-09-01 DIAGNOSIS — E669 Obesity, unspecified: Secondary | ICD-10-CM | POA: Diagnosis present

## 2014-09-01 DIAGNOSIS — Z6834 Body mass index (BMI) 34.0-34.9, adult: Secondary | ICD-10-CM | POA: Diagnosis not present

## 2014-09-01 DIAGNOSIS — Z713 Dietary counseling and surveillance: Secondary | ICD-10-CM | POA: Insufficient documentation

## 2014-09-01 NOTE — Progress Notes (Signed)
  Medical Nutrition Therapy:  Appt start time: 1530 end time:  1645.   Assessment:  Primary concerns today:  Obesity and constipation.  Patient accompanied by sister.  Patient lives with sister.  She does the shopping and the cooking.  Patient is in a wheel chair since CVA.  Sister reports that patient has schizophrenia and is followed at [facility]. Decreased appetite at times which patient relates to depression.  Patient on Abilify which also may contribute to weight. Unable to weigh patient due to inability to weight bear.  Weight per chart 03/31/14:  267 lbs with BMI of 34.  Preferred Learning Style:   Auditory  Learning Readiness:   Contemplating   MEDICATIONS: see list   DIETARY INTAKE:  Usual eating pattern includes 3 meals and 2-3 snacks per day.  24-hr recall:  B ( 7-10AM): 2 eggs, 2-3 strips bacon,  Wheat toast and 16 oz juice and coffee (with cream and  2 tsp sugar) Snk ( AM): banana and yogurt or graham crackers and other fruit L ( PM): Roast beef or [place] ham sandwich on Wheat bread with mayo, lettuce and cheese, 4 portions chips, 16 oz juice, fruit Snk ( PM): none D ( PM): chicken, macaroni or rice or potatoes, salad or peas Snk ( PM): yogurt or donuts or graham crackers or juice or banana Beverages: Juice, water, coffee with cream and 2 tsp sugar, 12 oz can soda at times, hot tea with 2 tsp sugar  Usual physical activity: wheelchair  Estimated energy needs: 1500-1600 calories 180 g carbohydrates 120 g protein 44 g fat  Progress Towards Goal(s):  In progress.   Nutritional Diagnosis:  NB-1.1 Food and nutrition-related knowledge deficit As related to weight control.  As evidenced by extreme obesity.    Intervention:  Nutrition counseling regarding weight management, portion control, and diet for constipation. Plan:  Aim for 3 Carb Choices per meal  Aim for 1-2 Carbs per snack if hungry  Include protein in moderation with your meals and snacks Watch portion  size of chips Drink more water and less juice Consider sugar substitutes in coffee and tea   Teaching Method Utilized:  Visual Auditory Hands on  Handouts given during visit include:  Constipation Nutrition Therapy  High Fiber Foods  My Plate and My Plate expanded  Low Carb Snack List  Barriers to learning/adherence to lifestyle change: inactivity (wheel chair), difficulty in limiting portion sizes  Demonstrated degree of understanding via:  Teach Back   Monitoring/Evaluation:  Dietary intake and body weight prn.

## 2014-09-01 NOTE — Patient Instructions (Signed)
Plan:  Aim for 3 Carb Choices per meal  Aim for 1-2 Carbs per snack if hungry  Include protein in moderation with your meals and snacks Watch portion size of chips Drink more water and less juice Consider sugar substitutes in coffee and tea

## 2016-03-09 ENCOUNTER — Ambulatory Visit (INDEPENDENT_AMBULATORY_CARE_PROVIDER_SITE_OTHER): Payer: Medicaid Other | Admitting: Neurology

## 2016-03-09 ENCOUNTER — Encounter: Payer: Self-pay | Admitting: Neurology

## 2016-03-09 ENCOUNTER — Telehealth: Payer: Self-pay | Admitting: Neurology

## 2016-03-09 VITALS — BP 128/84 | HR 78 | Resp 18

## 2016-03-09 DIAGNOSIS — M6289 Other specified disorders of muscle: Secondary | ICD-10-CM

## 2016-03-09 DIAGNOSIS — I638 Other cerebral infarction: Secondary | ICD-10-CM

## 2016-03-09 DIAGNOSIS — F203 Undifferentiated schizophrenia: Secondary | ICD-10-CM

## 2016-03-09 DIAGNOSIS — G4710 Hypersomnia, unspecified: Secondary | ICD-10-CM

## 2016-03-09 DIAGNOSIS — R351 Nocturia: Secondary | ICD-10-CM

## 2016-03-09 DIAGNOSIS — R51 Headache: Secondary | ICD-10-CM

## 2016-03-09 DIAGNOSIS — R519 Headache, unspecified: Secondary | ICD-10-CM

## 2016-03-09 DIAGNOSIS — G4733 Obstructive sleep apnea (adult) (pediatric): Secondary | ICD-10-CM

## 2016-03-09 DIAGNOSIS — G8191 Hemiplegia, unspecified affecting right dominant side: Secondary | ICD-10-CM

## 2016-03-09 DIAGNOSIS — F17200 Nicotine dependence, unspecified, uncomplicated: Secondary | ICD-10-CM

## 2016-03-09 DIAGNOSIS — Z720 Tobacco use: Secondary | ICD-10-CM | POA: Diagnosis not present

## 2016-03-09 DIAGNOSIS — I6389 Other cerebral infarction: Secondary | ICD-10-CM

## 2016-03-09 DIAGNOSIS — R531 Weakness: Secondary | ICD-10-CM

## 2016-03-09 NOTE — Telephone Encounter (Signed)
Medicaid will not cover a HST.  Can I get an order for an in lab sleep study?

## 2016-03-09 NOTE — Progress Notes (Signed)
Subjective:    Patient ID: Jared Preston is a 55 y.o. male.  HPI     Jared FoleySaima Vearl Aitken, MD, PhD [facility] [address], [address] [address] [place], [place] [address]  Dear Dr. August Saucerean,  I saw your patient, Jared Preston, upon your kind request in my neurologic clinic today for initial consultation of his sleep disorder, in particular, concern for underlying obstructive sleep apnea. The patient is accompanied by his sister today, who provides his history. As you know, Jared Preston is a 55 year old right-handed gentleman with an underlying medical history of mood disorder, chronic constipation, history of stroke with residual right-sided weakness in 2009, smoking and obesity, who reports snoring and excessive daytime somnolence. I reviewed your office note from 08/05/2015, which you kindly included. He had blood work at the time including A1c, CBC with differential, CMP, lipid panel, TSH, free T4 and vitamin D level. We will request blood test results from your office. His Epworth sleepiness score is 21 out of 24 today, his fatigue score is 63 out of 63. Her sister, he had a stroke in 2009 while he was in prison. He came home in 2014 but has since then gained a lot of weight. He is chronically constipated, he does not drink enough water. He is followed for his schizophrenia by the mental health clinic. She has noted that he has become more weak on the right side. She noticed this about a month ago. He reports that he had another stroke in May 2017. Essentially wheelchair-bound or recliner bound. He does not drive. Per sister, he snores loudly and quits breathing while asleep. He also reports headaches in the mornings. He has nocturia several times per night it is not able to specify and does not give his own history. He is on Abilify via injection once a month according to the sister.  His Past Medical History Is Significant For: Past Medical History  Diagnosis Date  . Paranoid  schizophrenia (HCC)   . Stroke (HCC)   . Chronic constipation   . Obesity (BMI 30.0-34.9)   . Hypertension     His Past Surgical History Is Significant For: No past surgical history on file.  His Family History Is Significant For: Family History  Problem Relation Age of Onset  . Congestive Heart Failure Mother   . COPD Maternal Uncle   . Diabetes Maternal Uncle   . Cancer Maternal Uncle     His Social History Is Significant For: Social History   Social History  . Marital Status: Single    Spouse Name: N/A  . Number of Children: 0  . Years of Education: some hs   Occupational History  . N/A    Social History Main Topics  . Smoking status: Current Every Day Smoker    Types: Cigarettes  . Smokeless tobacco: None  . Alcohol Use: 0.0 oz/week    0 Standard drinks or equivalent per week  . Drug Use: No  . Sexual Activity: No   Other Topics Concern  . None   Social History Narrative   Drinks coffee 3 times a week     His Allergies Are:  No Known Allergies:   His Current Medications Are:  Outpatient Encounter Prescriptions as of 03/09/2016  Medication Sig  . ARIPiprazole (ABILIFY) 9.75 MG/1.3ML injection Inject 9.75 mg into the muscle every 30 (thirty) days.  . cholecalciferol (VITAMIN D) 1000 UNITS tablet Take by mouth 2 (two) times daily.  . hydrochlorothiazide (HYDRODIURIL) 50  MG tablet   . ibuprofen (ADVIL,MOTRIN) 200 MG tablet Take 200 mg by mouth 2 (two) times daily.  . polyethylene glycol powder (GLYCOLAX/MIRALAX) powder   . potassium chloride SA (K-DUR,KLOR-CON) 20 MEQ tablet   . [DISCONTINUED] acetaminophen (TYLENOL) 325 MG tablet Take 650 mg by mouth every 6 (six) hours as needed for pain (pain).  . [DISCONTINUED] oxyCODONE-acetaminophen (PERCOCET) 5-325 MG per tablet Take 2 tablets by mouth every 6 (six) hours as needed for moderate pain. (Patient not taking: Reported on 09/01/2014)  . [DISCONTINUED] traMADol (ULTRAM) 50 MG tablet Take 50 mg by mouth 2  (two) times daily.   No facility-administered encounter medications on file as of 03/09/2016.  :  Review of Systems:  Out of a complete 14 point review of systems, all are reviewed and negative with the exception of these symptoms as listed below:   Review of Systems  Neurological:       Patient has trouble falling asleep, sister states that he has nightmares, snoring, witnessed apnea, wakes up feeling tired, daytime tiredness, takes naps.    Epworth Sleepiness Scale 0= would never doze 1= slight chance of dozing 2= moderate chance of dozing 3= high chance of dozing  Sitting and reading:3 Watching TV:3 Sitting inactive in a public place (ex. Theater or meeting):2 As a passenger in a car for an hour without a break:2 Lying down to rest in the afternoon:3 Sitting and talking to someone:3 Sitting quietly after lunch (no alcohol):2 In a car, while stopped in traffic:3 Total:21  Objective:  Neurologic Exam  Physical Exam Physical Examination:   Filed Vitals:   03/09/16 1552  BP: 128/84  Pulse: 78  Resp: 18    General Examination: The patient is a very pleasant 55 y.o. male in no acute distress. He is situated in a wheelchair. He is adequately groomed.   HEENT: Normocephalic, atraumatic, pupils are equal, round and reactive to light and accommodation. Extraocular tracking is fair, poor eye contact. Neck circumference is 18 and 72 inches. Oropharynx exam reveals poor dental hygiene and multiple missing teeth. He has a markedly crowded airway, secondary to large tongue, large uvula, and tonsils in place, about 1+ bilaterally. Speech is difficult to understand, mildly dysarthric. Face is slightly asymmetric with decrease in left nasolabial fold.   Chest: Clear to auscultation without wheezing, rhonchi or crackles noted.  Heart: S1+S2+0, regular and normal without murmurs, rubs or gallops noted.   Abdomen: Soft, non-tender and non-distended with normal bowel sounds appreciated  on auscultation.  Extremities: There is 1-2+ pitting edema in the distal lower extremities, right side more than left.   Skin: Warm and dry without trophic changes noted.   Musculoskeletal: exam reveals no obvious joint deformities, tenderness or joint swelling or erythema.   Neurologically:  Mental status: The patient is awake, patient fair attention. Language skills, attention, memory and knowledge are difficult to assess. He's not able to give his own history. He answers simple questions with one or 2 word sentences. Speech is mildly dysarthric. Mood is constricted and affect is blunted.  Cranial nerves II - XII are as described above under HEENT exam. In addition: shoulder shrug is normal with equal shoulder height noted. Motor exam: Normal bulk,  but appears globally weak, left-sided strength about 4 out of 5, right side strength in the upper extremity about 3 out of 5, right lower extremity strength about 4 out of 5. Reflexes are 1-2+ throughout.  Fine motor skills are globally impaired. He is unable to  stand or walk for me, Romberg is not testable either. Sensory exam is intact to light touch but otherwise difficult to assess.   Assessment and Plan:  In summary, Jared Preston is a very pleasant 55 y.o.-year old male with an underlying medical history of mood disorder, chronic constipation, history of stroke with residual right-sided weakness in 2009, smoking and obesity, whose history and physical exam are in keeping with obstructive sleep apnea. He has residual left-sided weakness from a prior stroke but appears to have recent onset of right-sided weakness as well. We will go ahead and do a head CT without contrast to call his sister with results. Given his motor debilitation we will proceed with a home sleep test to look for obstructive sleep apnea. I talked to the patient and particularly his sister about secondary stroke prevention the importance of making sure blood pressure, blood sugar,  cholesterol and thyroid function are all screened and under control. We talked about weight management. They are advised to follow-up with you for risk factor management. She is not sure when his cholesterol was checked last time. Furthermore, if his CT is negative for any intracranial hemorrhage, he should be on at least a baby aspirin.  About sleep apnea and the risks and ramifications of untreated OSA. He is strongly advised to quit smoking altogether and stay better hydrated with water. They are advised to seek dental care as well.  I will see him back after the sleep study is completed and we will be in touch over the phone as to his CT head results. I answered all their questions today and the patient and his sister were in agreement.   Thank you very much for allowing me to participate in the care of this nice patient. If I can be of any further assistance to you please do not hesitate to call me at [phone].  Sincerely,   Jared Foley, MD, PhD

## 2016-03-09 NOTE — Patient Instructions (Signed)
Based on your symptoms and your exam I believe you are at risk for obstructive sleep apnea or OSA, and I think we should proceed with a home sleep test. Please remember, the risks and ramifications of moderate to severe obstructive sleep apnea or OSA are: Cardiovascular disease, including congestive heart failure, stroke, difficult to control hypertension, arrhythmias, and even type 2 diabetes has been linked to untreated OSA. Sleep apnea causes disruption of sleep and sleep deprivation in most cases, which, in turn, can cause recurrent headaches, problems with memory, mood, concentration, focus, and vigilance. Most people with untreated sleep apnea report excessive daytime sleepiness, which can affect their ability to drive. Please do not drive if you feel sleepy.   I will likely see you back after your sleep study to go over the test results and where to go from there. We will call you after your sleep study to advise about the results (most likely, you will hear from Lafonda Mossesiana, my nurse) and to set up an appointment at the time, as necessary.     For your right sided weakness, we will do a head CT. We will call you with the results.

## 2016-03-09 NOTE — Telephone Encounter (Signed)
Order is in.

## 2016-03-15 ENCOUNTER — Telehealth: Payer: Self-pay | Admitting: Neurology

## 2016-03-15 NOTE — Telephone Encounter (Signed)
Sister called regarding scheduling CT Scan. Please call [phone].

## 2016-03-16 NOTE — Telephone Encounter (Signed)
[facility]. Called the patients sister to let her know and give her their phone number.

## 2016-03-24 ENCOUNTER — Other Ambulatory Visit: Payer: Medicaid Other

## 2016-03-28 ENCOUNTER — Telehealth: Payer: Self-pay

## 2016-03-28 NOTE — Telephone Encounter (Signed)
Scheduled HST with patients sister for 03/21/16. She did not come get study for patient. Called her number and it was out or order. Call the emergency contact listed and spoke with patients brother. He said he would talk with his sister. I have not heard back.

## 2016-04-20 ENCOUNTER — Emergency Department (HOSPITAL_COMMUNITY)
Admission: EM | Admit: 2016-04-20 | Discharge: 2016-04-21 | Disposition: A | Discharge status: Home / Self Care | Payer: Medicaid Other | Attending: Emergency Medicine | Admitting: Emergency Medicine

## 2016-04-20 ENCOUNTER — Emergency Department (HOSPITAL_COMMUNITY): Payer: Medicaid Other

## 2016-04-20 ENCOUNTER — Encounter (HOSPITAL_COMMUNITY): Payer: Self-pay

## 2016-04-20 DIAGNOSIS — K5900 Constipation, unspecified: Secondary | ICD-10-CM | POA: Diagnosis present

## 2016-04-20 DIAGNOSIS — Z79899 Other long term (current) drug therapy: Secondary | ICD-10-CM | POA: Insufficient documentation

## 2016-04-20 DIAGNOSIS — N3289 Other specified disorders of bladder: Secondary | ICD-10-CM

## 2016-04-20 DIAGNOSIS — R110 Nausea: Secondary | ICD-10-CM | POA: Diagnosis not present

## 2016-04-20 DIAGNOSIS — R109 Unspecified abdominal pain: Secondary | ICD-10-CM | POA: Insufficient documentation

## 2016-04-20 DIAGNOSIS — F17210 Nicotine dependence, cigarettes, uncomplicated: Secondary | ICD-10-CM | POA: Insufficient documentation

## 2016-04-20 DIAGNOSIS — I10 Essential (primary) hypertension: Secondary | ICD-10-CM | POA: Diagnosis not present

## 2016-04-20 DIAGNOSIS — N329 Bladder disorder, unspecified: Secondary | ICD-10-CM | POA: Diagnosis not present

## 2016-04-20 DIAGNOSIS — Z791 Long term (current) use of non-steroidal anti-inflammatories (NSAID): Secondary | ICD-10-CM | POA: Insufficient documentation

## 2016-04-20 DIAGNOSIS — I639 Cerebral infarction, unspecified: Secondary | ICD-10-CM | POA: Insufficient documentation

## 2016-04-20 LAB — CBC WITH DIFFERENTIAL/PLATELET
BASOS ABS: 0.1 10*3/uL (ref 0.0–0.1)
BASOS PCT: 1 %
EOS ABS: 0.2 10*3/uL (ref 0.0–0.7)
EOS PCT: 3 %
HEMATOCRIT: 40.8 % (ref 39.0–52.0)
Hemoglobin: 13.5 g/dL (ref 13.0–17.0)
Lymphocytes Relative: 28 %
Lymphs Abs: 2.6 10*3/uL (ref 0.7–4.0)
MCH: 30.5 pg (ref 26.0–34.0)
MCHC: 33.1 g/dL (ref 30.0–36.0)
MCV: 92.3 fL (ref 78.0–100.0)
MONO ABS: 0.6 10*3/uL (ref 0.1–1.0)
Monocytes Relative: 6 %
NEUTROS ABS: 6 10*3/uL (ref 1.7–7.7)
Neutrophils Relative %: 62 %
PLATELETS: 266 10*3/uL (ref 150–400)
RBC: 4.42 MIL/uL (ref 4.22–5.81)
RDW: 13.6 % (ref 11.5–15.5)
WBC: 9.4 10*3/uL (ref 4.0–10.5)

## 2016-04-20 LAB — BASIC METABOLIC PANEL
ANION GAP: 8 (ref 5–15)
BUN: 12 mg/dL (ref 6–20)
CALCIUM: 8.9 mg/dL (ref 8.9–10.3)
CO2: 27 mmol/L (ref 22–32)
Chloride: 102 mmol/L (ref 101–111)
Creatinine, Ser: 0.72 mg/dL (ref 0.61–1.24)
Glucose, Bld: 93 mg/dL (ref 65–99)
Potassium: 3.2 mmol/L — ABNORMAL LOW (ref 3.5–5.1)
SODIUM: 137 mmol/L (ref 135–145)

## 2016-04-20 LAB — PROTIME-INR
INR: 0.92
PROTHROMBIN TIME: 12.4 s (ref 11.4–15.2)

## 2016-04-20 MED ORDER — IOPAMIDOL (ISOVUE-300) INJECTION 61%
100.0000 mL | Freq: Once | INTRAVENOUS | Status: AC | PRN
  Administered 2016-04-20: 100 mL via INTRAVENOUS

## 2016-04-20 NOTE — ED Notes (Signed)
Bed: WA08 Expected date:  Expected time:  Means of arrival:  Comments: EMS- 54yo M, constipation x 6 weeks

## 2016-04-20 NOTE — ED Notes (Signed)
Patient transported to CT 

## 2016-04-20 NOTE — ED Triage Notes (Signed)
Per EMS- patient c/o constipation x 6 weeks. Patient's sister reported that the patient has seen his PCP, has tried enemas, and laxatives with no results.

## 2016-04-20 NOTE — ED Provider Notes (Signed)
[facility]-EMERGENCY DEPT Provider Note   CSN: [id number] Arrival date & time: 04/20/16  1844 By signing my name below, I, Levon Hedger, attest that this documentation has been prepared under the direction and in the presence of non-physician practitioner, Antony Madura, PA-C  Electronically Signed: Levon Hedger, Scribe. 04/20/2016. 8:22 PM.   History   Chief Complaint Chief Complaint  Patient presents with  . Constipation    HPI Jared Preston is a 55 y.o. male with Hx of chronic constipation, HTN, stroke, and paranoid schizophrenia who presents to the Emergency Department complaining of constant, painful constipation which began 6 weeks ago. He states his last BM was 2-3 days ago. Per pt's sister, his last bowel movement was after a suppository and was only a small amount of water. He has taken 6 enemas, Miralax, Ducalax, Pepto bismol, and amitiza with no relief.  He states it feels as if the stool is "blocking up into his chest". He notes associated abdominal pain, abdominal distention, tightness, and nausea. Pt's sister states he may have had another stroke in June 2017, but has not had a CT scan yet due to behavioral issues. She states pt has hx of left sided weakness, which has worsened. Pt is not currently followed by a neurologist. Pt is on hydrochlorothiazide and potassium. He denies any hx of abdominal surgery. He denies vomiting or fevers.   The history is provided by the patient and a relative. No language interpreter was used.    Past Medical History:  Diagnosis Date  . Chronic constipation   . Hypertension   . Obesity (BMI 30.0-34.9)   . Paranoid schizophrenia (HCC)   . Stroke [facility])     There are no active problems to display for this patient.   History reviewed. No pertinent surgical history.    Home Medications    Prior to Admission medications   Medication Sig Start Date End Date Taking? Authorizing Provider  AMITIZA 24 MCG capsule Take 24 mg by mouth 2 (two) times  daily. 03/28/16  Yes Historical Provider, MD  ARIPiprazole (ABILIFY) 9.75 MG/1.3ML injection Inject 9.75 mg into the muscle every 30 (thirty) days.   Yes Historical Provider, MD  cholecalciferol (VITAMIN D) 1000 UNITS tablet Take 1,000 Units by mouth 2 (two) times daily.    Yes Historical Provider, MD  hydrochlorothiazide (HYDRODIURIL) 50 MG tablet Take 50 mg by mouth daily.  02/19/16  Yes Historical Provider, MD  ibuprofen (ADVIL,MOTRIN) 200 MG tablet Take 200 mg by mouth 2 (two) times daily as needed for moderate pain.    Yes Historical Provider, MD  potassium chloride SA (K-DUR,KLOR-CON) 20 MEQ tablet Take 20 mEq by mouth 2 (two) times daily.  02/19/16  Yes Historical Provider, MD  polyethylene glycol powder (GLYCOLAX/MIRALAX) powder Take 17 g by mouth 2 (two) times daily. 04/21/16   Antony Madura, PA-C    Family History Family History  Problem Relation Age of Onset  . Congestive Heart Failure Mother   . COPD Maternal Uncle   . Diabetes Maternal Uncle   . Cancer Maternal Uncle     Social History Social History  Substance Use Topics  . Smoking status: Current Every Day Smoker    Types: Cigarettes  . Smokeless tobacco: Never Used  . Alcohol use 0.0 oz/week     Allergies   Review of patient's allergies indicates no known allergies.   Review of Systems Review of Systems  Constitutional: Negative for fever.  Gastrointestinal: Positive for abdominal distention, abdominal pain, constipation and  nausea. Negative for vomiting.  All other systems reviewed and are negative.   Physical Exam Updated Vital Signs BP 123/77 (BP Location: Left Arm)   Pulse 64   Temp 97.7 F (36.5 C) (Oral)   Resp 17   Ht 6\' 2"  (1.88 m)   Wt 122 kg   SpO2 98%   BMI 34.54 kg/m   Physical Exam  Constitutional: He is oriented to person, place, and time. He appears well-developed and well-nourished. No distress.  Nontoxic appearing  HENT:  Head: Normocephalic and atraumatic.  Eyes: Conjunctivae and  EOM are normal. No scleral icterus.  Neck: Normal range of motion.  Cardiovascular: Normal rate, regular rhythm and intact distal pulses.   Pulmonary/Chest: Effort normal. No respiratory distress. He has no wheezes.  Respirations even and unlabored.  Abdominal: Soft.  Soft, obese abdomen with generalized TTP. No masses palpable. No peritoneal signs.  Genitourinary:  Genitourinary Comments: Normal rectal tone. No melena or hematochezia. Stool soft, brown. No stool palpated in rectal vault. Exam chaperoned by scribe.  Musculoskeletal: Normal range of motion.  Neurological: He is alert and oriented to person, place, and time.  GCS 15. Patient answers questions appropriately and follows commands.  Skin: Skin is warm and dry. No rash noted. He is not diaphoretic. No erythema. No pallor.  Psychiatric: He has a normal mood and affect. His behavior is normal.  Nursing note and vitals reviewed.    ED Treatments / Results  DIAGNOSTIC STUDIES:  Oxygen Saturation is 98% on RA, normal by my interpretation.    COORDINATION OF CARE:  8:19 PM Discussed treatment plan with pt at bedside and pt agreed to plan.   Labs (all labs ordered are listed, but only abnormal results are displayed) Labs Reviewed  BASIC METABOLIC PANEL - Abnormal; Notable for the following:       Result Value   Potassium 3.2 (*)    All other components within normal limits  CBC WITH DIFFERENTIAL/PLATELET  PROTIME-INR    EKG  EKG Interpretation None       Radiology Ct Head Wo Contrast  Result Date: 04/20/2016 CLINICAL DATA:  Left-side weakness, worsening.  Initial encounter. EXAM: CT HEAD WITHOUT CONTRAST TECHNIQUE: Contiguous axial images were obtained from the base of the skull through the vertex without intravenous contrast. COMPARISON:  None. FINDINGS: Brain: Appears normal without hemorrhage, infarct, mass lesion, mass effect, midline shift or abnormal extra-axial fluid collection. No hydrocephalus or  pneumocephalus. Vascular: Mild calcific carotid atherosclerosis is noted. Skull: Unremarkable. Sinuses/Orbits: Unremarkable. Other: None. IMPRESSION: No acute abnormality. Mild calcific carotid atherosclerosis. Electronically Signed   By: Drusilla Kanner M.D.   On: 04/20/2016 20:57   Ct Abdomen Pelvis W Contrast  Result Date: 04/20/2016 CLINICAL DATA:  Constipation EXAM: CT ABDOMEN AND PELVIS WITH CONTRAST TECHNIQUE: Multidetector CT imaging of the abdomen and pelvis was performed using the standard protocol following bolus administration of intravenous contrast. CONTRAST:  ISOVUE-300 IOPAMIDOL (ISOVUE-300) INJECTION 61% COMPARISON:  None. FINDINGS: Lower chest and abdominal wall: 2 fatty midline abdominal wall hernias that are small. Hepatobiliary: No focal liver abnormality.No evidence of biliary obstruction or stone. Pancreas: Unremarkable. Spleen: Unremarkable. Adrenals/Urinary Tract: 18 x 14 mm left adrenal nodule. Anticipate follow-up due to bladder findings. No hydronephrosis or stone. 14 mm high-density polypoid mass along the left lateral wall. Stomach/Bowel:  No obstruction. No appendicitis. Reproductive:No pathologic findings. Vascular/Lymphatic: No acute vascular abnormality. Mild scattered atherosclerotic calcification. No mass or adenopathy. Other: No ascites or pneumoperitoneum. Musculoskeletal: No acute abnormalities. Focally  advanced L4-5 disc degeneration IMPRESSION: 1. No acute finding.  Negative for bowel obstruction. 2. 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 Recommend urology referral. 3. Small fatty midline hernias. 4. 18 mm left adrenal nodule without diagnostic imaging feature but at this size probably benign. Consider 12 month follow-up CT or MRI. Electronically Signed   By: Marnee SpringJonathon  Watts M.D.   On: 04/20/2016 23:57   Dg Abd 2 Views  Result Date: 04/20/2016 CLINICAL DATA:  Constipation 5-6 weeks. EXAM: ABDOMEN - 2 VIEW COMPARISON:  03/31/2014 FINDINGS:  Air is present throughout the colon. There are a few air-filled mildly dilated small bowel loops in the left abdomen. No free peritoneal air. Remainder of the exam is unchanged. IMPRESSION: Several air-filled mildly dilated small bowel loops. Findings may be due to focal ileus versus early/partial small bowel obstruction. Electronically Signed   By: Elberta Fortisaniel  Boyle M.D.   On: 04/20/2016 21:09    Procedures Procedures (including critical care time)  Medications Ordered in ED Medications  iopamidol (ISOVUE-300) 61 % injection 100 mL (100 mLs Intravenous Contrast Given 04/20/16 2334)     Initial Impression / Assessment and Plan / ED Course  I have reviewed the triage vital signs and the nursing notes.  Pertinent labs & imaging results that were available during my care of the patient were reviewed by me and considered in my medical decision making (see chart for details).  Clinical Course    55 year old male presents to the emergency department for complaints of constipation. Sister reports that he has not had a normal bowel movement in the past 6 weeks. He has been on a number of stool softeners as well as MiraLAX. Sister has been using daily enemas without relief. Patient with no palpable stool in rectal vault. X-ray initially obtained to evaluate for stool burden or obstruction. X-ray unable to rule out partial bowel obstruction; therefore, CT scan was obtained. CT today shows no evidence of bowel obstruction or significant stool burden. Laboratory workup is also reassuring. I do not believe the patient requires an enema today, especially given chronicity of his symptoms. Sister reports that the patient has run out of his MiraLAX prescription. This will be provided to him.  CT abdomen pelvis does reveal a small mass concerning for bladder cancer. I have discussed these findings with the patient and his sister at bedside. They have been notified of his need to follow-up with a urologist for further  management.  CT head performed today as patient was pending an outpatient CT to rule out new stroke secondary to some new weakness in the patient's right upper and lower extremity. He has a history of stroke with residual left-sided deficits. He is wheelchair bound as a result of this. Sister is the patient's caregiver. Recent CT was unable to be performed secondary to patient's psychiatric state. On review of head CT today, there is no evidence of acute stroke.  In light of these findings, I do not believe that further emergent workup is indicated. The patient has been instructed to follow-up with his primary care doctor regarding his ED visit today. Return precautions discussed and provided. Patient discharged in satisfactory condition. Patient and sister with no unaddressed concerns.   Vitals:   04/20/16 1856 04/20/16 2158 04/21/16 0019 04/21/16 0100  BP: 121/70 127/79 117/82 123/77  Pulse: 68 62 62 64  Resp: 16 17 18 17   Temp: 97.7 F (36.5 C)     TempSrc: Oral     SpO2: 98%  98% 97% 98%  Weight: 122 kg     Height: 6\' 2"  (1.88 m)       Final Clinical Impressions(s) / ED Diagnoses   Final diagnoses:  Constipation  Constipation, unspecified constipation type  Mass of bladder    I personally performed the services described in this documentation, which was scribed in my presence. The recorded information has been reviewed and is accurate.    New Prescriptions Discharge Medication List as of 04/21/2016  1:51 AM       Antony MaduraKelly Lashun Ramseyer, PA-C 04/21/16 [id number]    Tilden FossaElizabeth Rees, MD 04/21/16 [phone]

## 2016-04-21 MED ORDER — POLYETHYLENE GLYCOL 3350 17 GM/SCOOP PO POWD: 17.0000 g | Freq: Two times a day (BID) | ORAL | 1 refills | Status: DC

## 2016-04-21 NOTE — Discharge Instructions (Signed)
Your CT today shows findings that are consistent with bladder cancer. We recommend that you follow-up with a urologist as soon as you are able for further evaluation of these findings. Follow-up with a gastroenterologist regarding your persisting constipation. Continue taking MiraLAX daily.

## 2016-04-21 NOTE — ED Notes (Signed)
[facility] has been called for transportation. Wife took discharge papers home.

## 2016-08-09 ENCOUNTER — Other Ambulatory Visit: Payer: Self-pay | Admitting: Gastroenterology

## 2016-08-09 ENCOUNTER — Ambulatory Visit
Admission: RE | Admit: 2016-08-09 | Discharge: 2016-08-09 | Disposition: A | Discharge status: Home / Self Care | Payer: Medicaid Other | Source: Ambulatory Visit | Attending: Gastroenterology | Admitting: Gastroenterology

## 2016-08-09 DIAGNOSIS — K5900 Constipation, unspecified: Secondary | ICD-10-CM

## 2016-08-17 ENCOUNTER — Ambulatory Visit (HOSPITAL_COMMUNITY): Admission: RE | Admit: 2016-08-17 | Payer: Medicaid Other | Source: Ambulatory Visit

## 2016-08-18 ENCOUNTER — Other Ambulatory Visit (HOSPITAL_COMMUNITY): Payer: Self-pay | Admitting: Gastroenterology

## 2016-08-18 DIAGNOSIS — K5900 Constipation, unspecified: Secondary | ICD-10-CM

## 2016-08-25 ENCOUNTER — Ambulatory Visit (HOSPITAL_COMMUNITY): Payer: Medicaid Other

## 2016-08-26 ENCOUNTER — Ambulatory Visit (HOSPITAL_COMMUNITY)
Admission: RE | Admit: 2016-08-26 | Discharge: 2016-08-26 | Disposition: A | Discharge status: Home / Self Care | Payer: Medicaid Other | Source: Ambulatory Visit | Attending: Gastroenterology | Admitting: Gastroenterology

## 2016-08-26 ENCOUNTER — Other Ambulatory Visit (HOSPITAL_COMMUNITY): Payer: Self-pay | Admitting: Gastroenterology

## 2016-08-26 DIAGNOSIS — K5900 Constipation, unspecified: Secondary | ICD-10-CM | POA: Diagnosis present

## 2016-10-03 IMAGING — CT CT ABD-PELV W/ CM
2 of 5 series · 16 of 46 positions shown, 18 images · IV contrast (ISOVUE)
Comparison: None.

CLINICAL DATA: Constipation

EXAM:
CT ABDOMEN AND PELVIS WITH CONTRAST
TECHNIQUE: Multidetector CT imaging of the abdomen and pelvis was performed
using the standard protocol following bolus administration of
intravenous contrast.
CONTRAST:  100mL [id number] IOPAMIDOL ([id number]) INJECTION 61%

[Series 2: abd/pel with · axial · 0.91mm/px · z∈[-484,-34]mm · 13 of 104 slices shown, 15 images]
[im 7/104  soft-tissue]
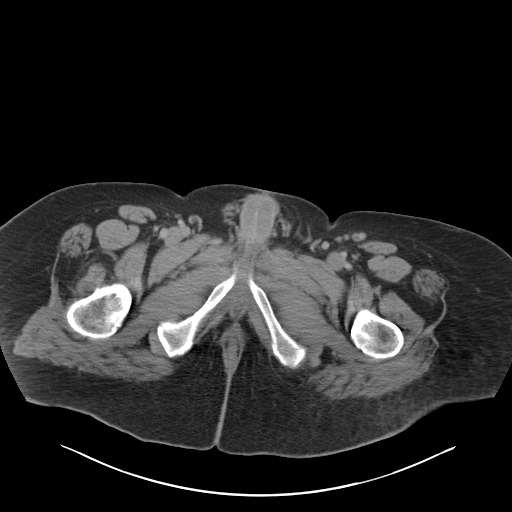
[im 7/104  bone]
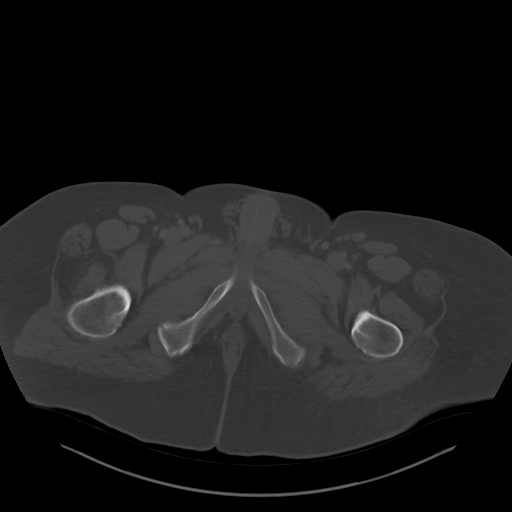
[im 13/104  soft-tissue]
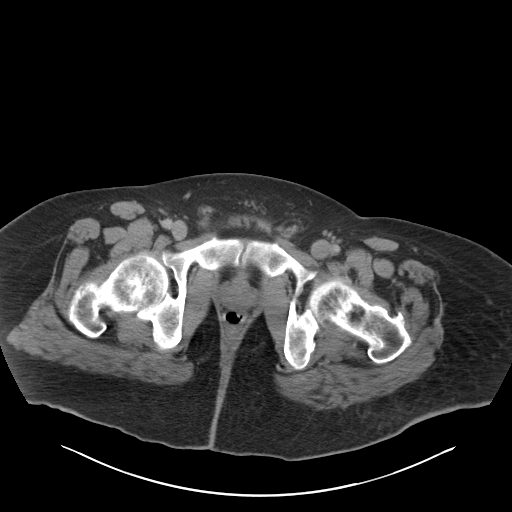
[im 25/104  soft-tissue]
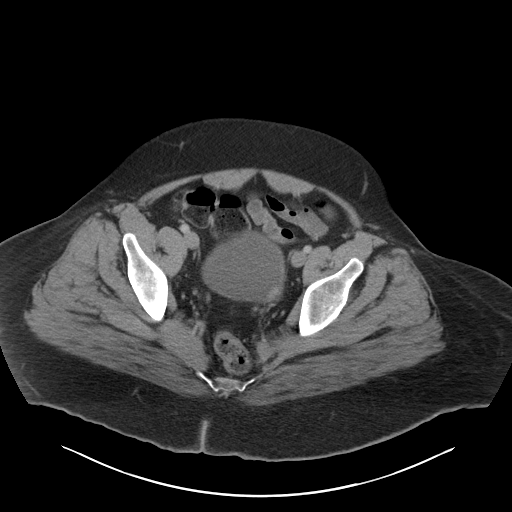
[im 31/104  soft-tissue]
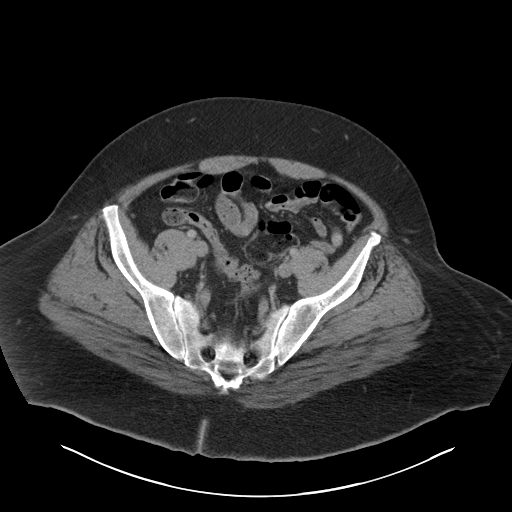
[im 37/104  soft-tissue]
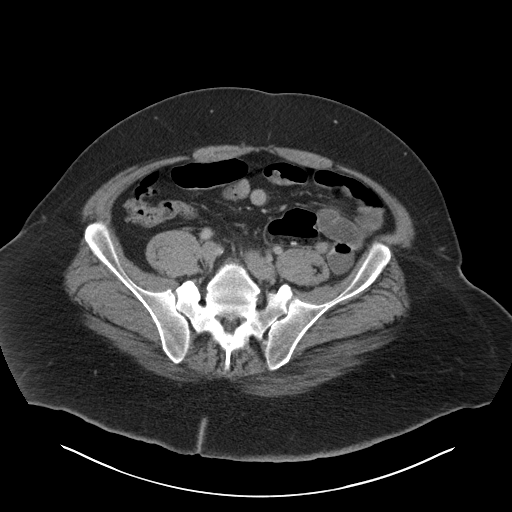
[im 43/104  soft-tissue]
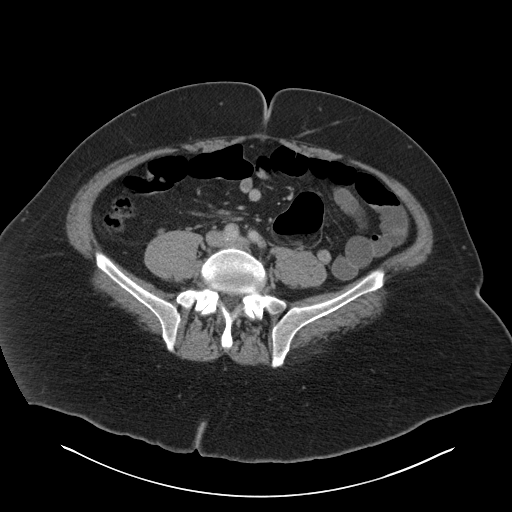
[im 55/104  soft-tissue]
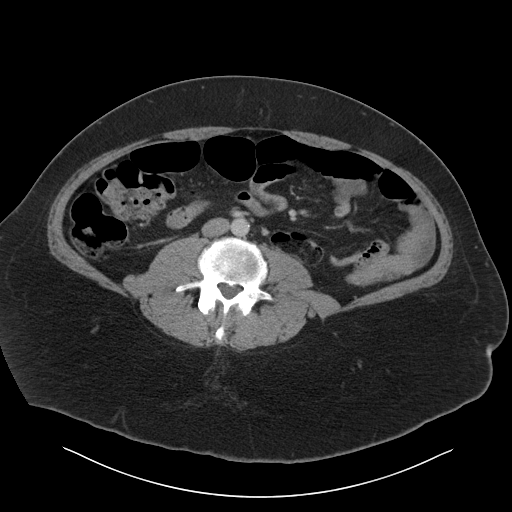
[im 61/104  soft-tissue]
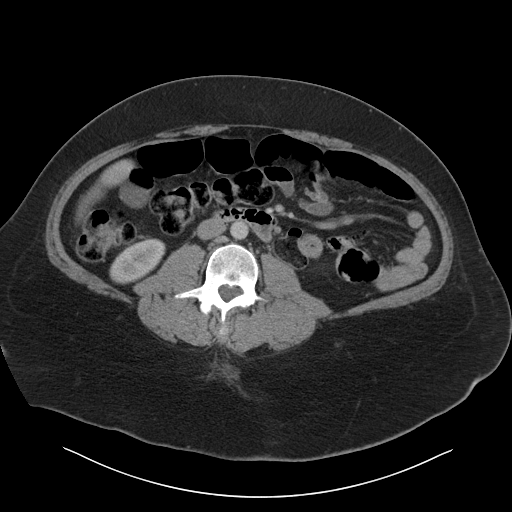
[im 67/104  soft-tissue]
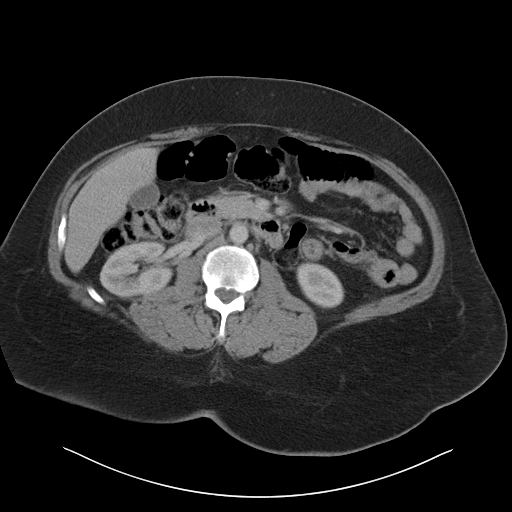
[im 67/104  bone]
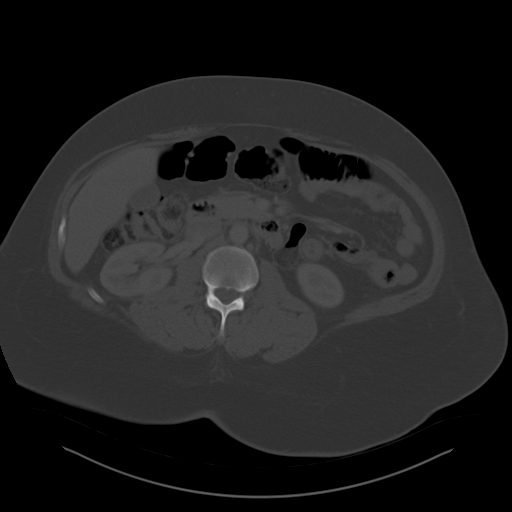
[im 73/104  soft-tissue]
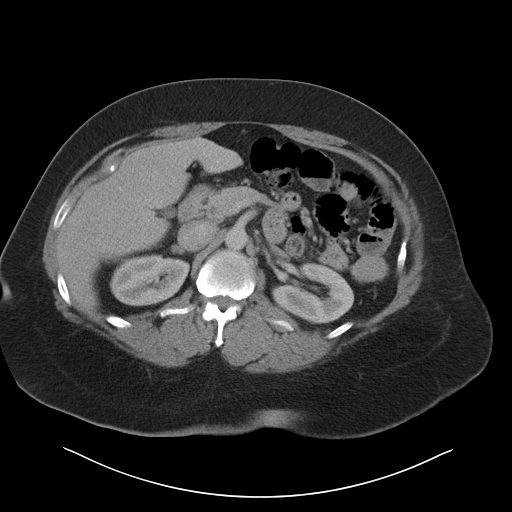
[im 79/104  soft-tissue]
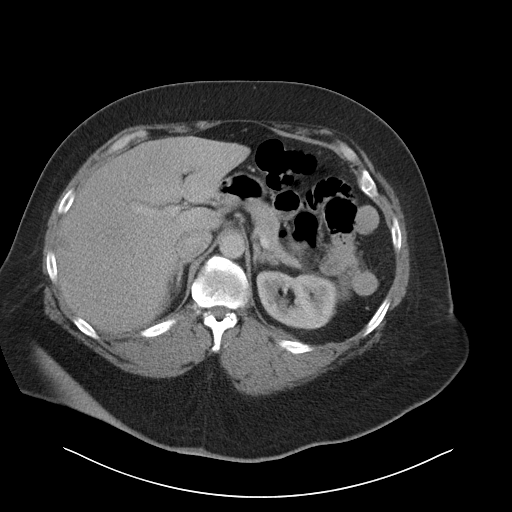
[im 91/104  soft-tissue]
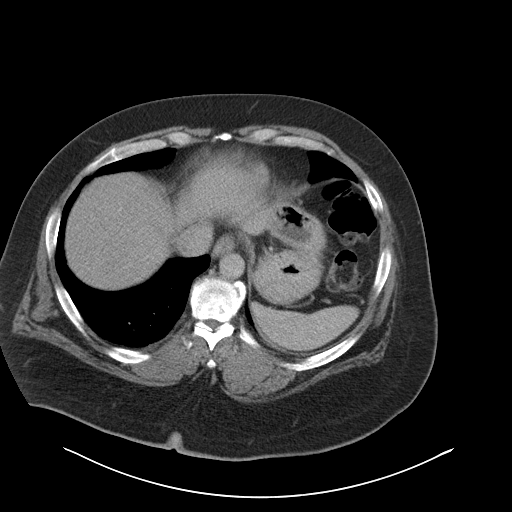
[im 97/104  soft-tissue]
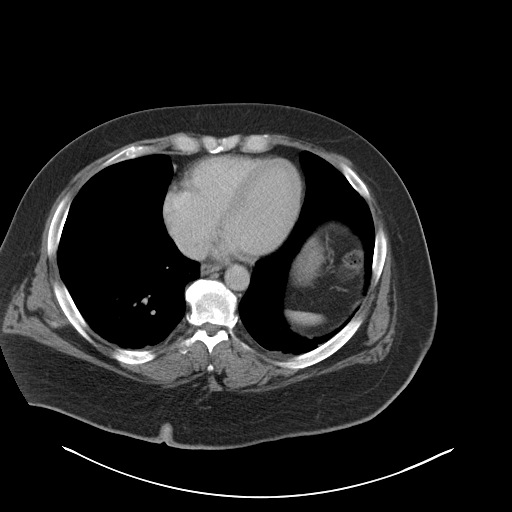

[Series 5: coronal a/|p · coronal · 0.85mm/px · 3 of 180 slices shown]
[im 60/180  soft-tissue]
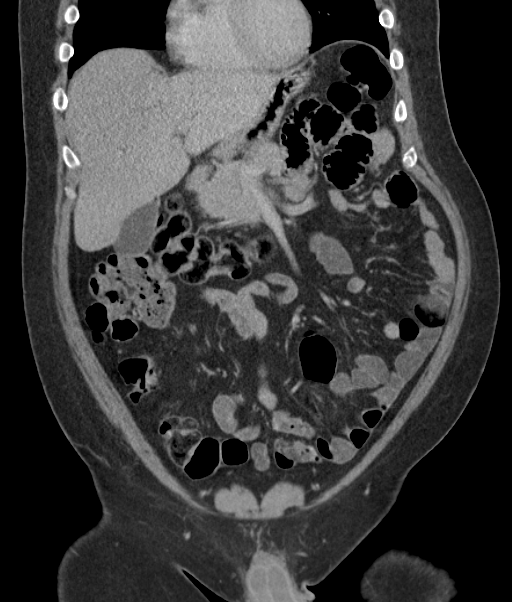
[im 80/180  soft-tissue]
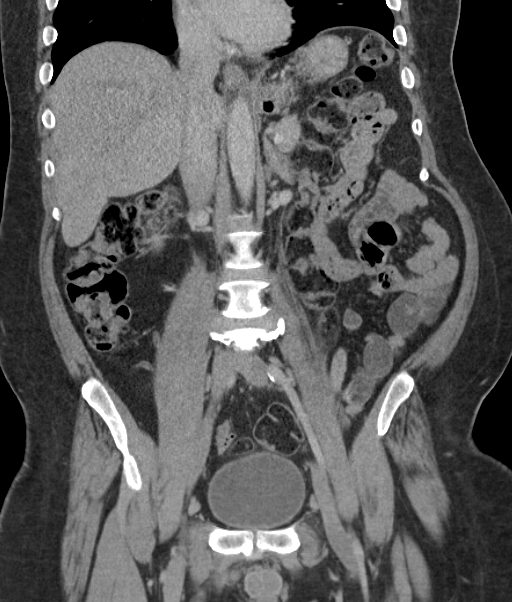
[im 100/180  soft-tissue]
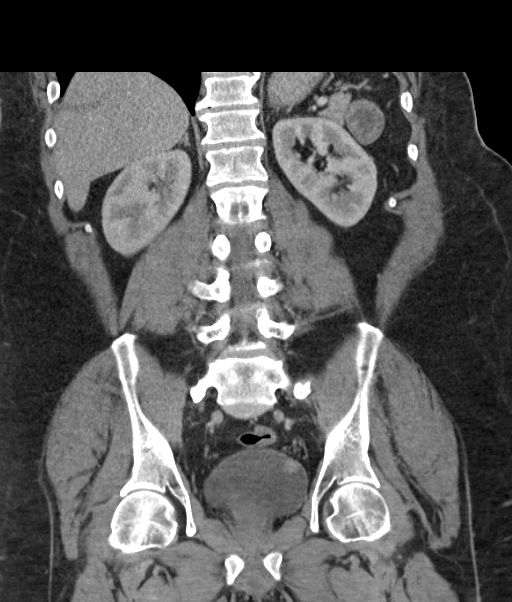

[16 of 46 positions shown; findings below may reference images not displayed]

FINDINGS: Lower chest and abdominal wall: 2 fatty midline abdominal wall
hernias that are small.

Hepatobiliary: No focal liver abnormality.No evidence of biliary
obstruction or stone.

Pancreas: Unremarkable.

Spleen: Unremarkable.

Adrenals/Urinary Tract: 18 x 14 mm left adrenal nodule. Anticipate
follow-up due to bladder findings. No hydronephrosis or stone. 14 mm
high-density polypoid mass along the left lateral wall.

Stomach/Bowel:  No obstruction. No appendicitis.

Reproductive:No pathologic findings.

Vascular/Lymphatic: No acute vascular abnormality. Mild scattered
atherosclerotic calcification. No mass or adenopathy.

Other: No ascites or pneumoperitoneum.

Musculoskeletal: No acute abnormalities. Focally advanced L4-5 disc
degeneration
IMPRESSION: 1. No acute finding.  Negative for bowel obstruction.
2. 14 mm bladder mass consistent with a urothelial carcinoma.
Recommend urology referral.
3. Small fatty midline hernias.
4. 18 mm left adrenal nodule without diagnostic imaging feature but
at this size probably benign. Consider 12 month follow-up CT or MRI.

## 2016-10-26 ENCOUNTER — Ambulatory Visit (INDEPENDENT_AMBULATORY_CARE_PROVIDER_SITE_OTHER): Payer: Medicaid Other | Admitting: Neurology

## 2016-10-26 DIAGNOSIS — G4733 Obstructive sleep apnea (adult) (pediatric): Secondary | ICD-10-CM

## 2016-11-01 NOTE — Addendum Note (Signed)
Addended by: Huston FoleyATHAR, Ki Corbo on: 11/01/2016 08:48 AM   Modules accepted: Orders

## 2016-11-01 NOTE — Procedures (Signed)
  [facility] [address], [address] [place], [place] [address]  NAME: Jared BitterDavid Preston DOB:  [birth year] MEDICAL RECORD NUMBER [id number] DOS: 10/27/16 REFERRING PHYSICIAN: Willey BladeEric Dean, MD  Study Performed:  HST/Out of Center Sleep Test  HISTORY: 56 year old man with a history of mood disorder, chronic constipation, history of stroke with residual right-sided weakness in 2009, smoking and obesity, who reports snoring and excessive daytime somnolence. Epworth sleepiness score is 21 out of 24, fatigue score is 63 out of 63.  He reports headaches in the mornings. He has nocturia several times per night.   STUDY RESULTS:  Total Recording Time:  6h 8min  Total Evaluation Time: 53 min   Total Apnea/Hypopnea Index (AHI): 5.7  Average Oxygen Saturation: 93%  Lowest Oxygen Saturation: 83%  Average Mean Heart Rate: 61bpm  IMPRESSION:  Obstructive Sleep Apnea (OSA)    RECOMMENDATION:  This home sleep test demonstrates obstructive sleep apnea with a total AHI of 5.7/hour and O2 nadir of 83%. There is only a short evaluation time, but does suggest the diagnosis of OSA. Given the patient's medical history and sleep related complaints, treatment with positive airway pressure (in the form of CPAP or AutoPAP) is recommended. This will be discussed with the patient. The patient and his referring provider will be notified of the test results.  Please note that untreated obstructive sleep apnea carries additional perioperative morbidity. Patients with significant obstructive sleep apnea should receive perioperative PAP therapy and the surgeons and particularly the anesthesiologist should be informed of the diagnosis and the severity of the sleep disordered breathing. The patient should be cautioned not to drive, work at heights, or operate dangerous or heavy equipment when tired or sleepy. Review and reiteration of good sleep hygiene measures should be pursued with any patient. The  patient [facility]. I certify that I have reviewed the raw data recording prior to the issuance of this report in accordance with the standards of Accreditation of the American Academy of Sleep medicine (AASM).     Huston FoleySaima Ann Bohne, MD, PhD [facility]) Diplomat, ABPN (neurology and sleep)

## 2016-11-01 NOTE — Progress Notes (Signed)
Patient referred by Dr. August Saucerean, seen by me on 03/09/16, HST on 10/27/16:  Please call and notify the patient that the recent home sleep test did suggest the diagnosis of moderate obstructive sleep apnea and that I recommend treatment for this in the form of autoPAP. I will place an order. Pls process order and arrange for FU in 10 weeks. Please explain to patient that the DME co will be in touch with him to arrange PAP set up and education. Thanks, and please route report to ref MD.    Huston FoleySaima Nur Rabold, MD, PhD [facility] ([facility])

## 2016-11-04 ENCOUNTER — Telehealth: Payer: Self-pay

## 2016-11-04 NOTE — Telephone Encounter (Signed)
I spoke to sister (caregiver) and she is aware of results and recommendations. She stated that he will start rehab for a few days for his weakness soon. [facility]. Sister voiced understanding.

## 2016-11-04 NOTE — Telephone Encounter (Signed)
-----   Message from Huston FoleySaima Athar, MD sent at 11/01/2016  8:48 AM EST ----- Patient referred by Dr. August Saucerean, seen by me on 03/09/16, HST on 10/27/16:  Please call and notify the patient that the recent home sleep test did suggest the diagnosis of moderate obstructive sleep apnea and that I recommend treatment for this in the form of autoPAP. I will place an order. Pls process order and arrange for FU in 10 weeks. Please explain to patient that the DME co will be in touch with him to arrange PAP set up and education. Thanks, and please route report to ref MD.    Huston FoleySaima Athar, MD, PhD [facility] ([facility])

## 2016-11-23 ENCOUNTER — Telehealth: Payer: Self-pay | Admitting: Neurology

## 2016-11-23 NOTE — Telephone Encounter (Signed)
Pt sister called to inform that [facility] Medicaid states they will not accept the Home Sleep study and are wanting pt to go to a facility to have the sleep study done. Pt sister was advised to contact [facility]  To see what can be done to inform Medicaid that the reason for the Home sleep study is due to pt being paralysed and unable to get up and down.

## 2016-11-24 NOTE — Telephone Encounter (Signed)
I sent [facility] a message to see exactly what is going on.

## 2016-11-24 NOTE — Telephone Encounter (Signed)
After speaking with Zella BallRobin, it will be easier to send referral to another DME. I called the sister and advised her that Onalee HuaDavid does meet CPAP requirements and we will send orders to [facility]. Sister states that she is ok with this plan and will call us back if any questions.   Referral sent to [facility]

## 2016-11-24 NOTE — Telephone Encounter (Signed)
This is the message I received from [facility]:  Cornelious Bryant,   Unfortunately Medicaid will not accept a home sleep study unless the following guidelines are met; and we didn't see that in the notes. Please advise Dr. Rexene Alberts that the patient will need to have a lab diagnostic sleep study done.   Unattended sleep studies are covered when all of the following are met:  1. Type II or Type III device is used as described below:  A. Type II: Comprehensive, portable sleep study Minimum of 7 parameters  including EEG, EOG, chin EMG, ECG or heart rate, airflow, respiratory  effort, oxygen saturation  B. Type III: Modified portable sleep apnea testing Minimum of 4 parameters,  including ventilation (at least 2 channels of respiratory movement, or  respiratory movement and airflow), heart rate or ECG, and oxygen  saturation)  2. Service shall be provided by a physician who meets all eligibility qualifications  for participation in Section 6.0.  The qualifications of the physician who interprets and bills the unattended sleep  studies (HST-Type II or, III,) shall include at least one of the following:  A. Current certification in Sleep Medicine by the American Board of Sleep  Medicine (ABSM), OR  B. Current subspecialty certification in Sleep Medicine by a member board of  the [employer] (ABMS), OR  C. Completed residency or fellowship training by an ABMS member board and  has completed all the requirements for subspecialty certification in sleep  medicine except the examination itself and only until the time of reporting  of the first examination for which the physician is eligible, OR  D. Active staff membership of a sleep center or laboratory accredited by the  [employer] of Sleep Medicine (AASM) or The Joint Commission  (Formerly the [employer] on Garment/textile technologist).  3. The test shall be interpreted by a physician qualified to read full sleep studies   4. All of the raw data shall be examined by the reading physician  5. The test shall gather a minimum of 6 hours of data collected during the  beneficiary's usual sleeping period  6. The Beneficiary meets the following criteria:  A. High pretest probability of OSA with at least 4 of the following symptoms  are considered to be at high risk for OSA:  i. habitual snoring  ii. observed apneas  iii. wakes choking and gasping for air  iv. morning headaches  v. excessive daytime sleepiness  vi. a body mass index greater than 35  7. OSA is suspected and in-laboratory PSG is not possible or diagnosis of OSA has  been established, therapy has been initiated, and response to treatment is to be  evaluated, and no significant co-morbid conditions exist that could impact the  accuracy of the study (e.g., moderate to severe pulmonary disease,  neuromuscular disease, congestive heart failure) or no sleep disorders other than  OSA are suspected (e.g., central sleep apnea, periodic limb movement disorder,  insomnia, parasomnias, circadian rhythm   Thanks.  Angie

## 2016-11-24 NOTE — Telephone Encounter (Signed)
He meets all the requirements [facility].  1-Type 111 test done 2-Physician meets all requirements 3-5 meets requirements 6A- meets 4 of these 7- OSA is suspected and PSG is not possible due to paralysis of patient and no significant co-morbid conditions and no other suspected  Sleep disorders  [facility] and if they have questions have them call me

## 2017-02-07 ENCOUNTER — Ambulatory Visit: Payer: Self-pay | Admitting: Neurology

## 2017-04-05 ENCOUNTER — Ambulatory Visit: Payer: Self-pay | Admitting: Neurology

## 2017-05-22 ENCOUNTER — Ambulatory Visit: Payer: Medicaid Other | Attending: Primary Care | Admitting: Physical Therapy

## 2017-05-22 DIAGNOSIS — M545 Low back pain: Secondary | ICD-10-CM | POA: Insufficient documentation

## 2017-05-22 DIAGNOSIS — G8929 Other chronic pain: Secondary | ICD-10-CM | POA: Diagnosis present

## 2017-05-22 DIAGNOSIS — M25511 Pain in right shoulder: Secondary | ICD-10-CM | POA: Insufficient documentation

## 2017-05-22 DIAGNOSIS — M25512 Pain in left shoulder: Secondary | ICD-10-CM | POA: Diagnosis not present

## 2017-05-22 NOTE — Therapy (Signed)
[facility] [address] [place], [place], [address] Phone: [phone]   Fax:  [phone]  Physical Therapy Evaluation  Patient Details  Name: Jared Preston MRN: [id number] Date of Birth: 03-Jul-1961 Referring Provider: Grayce Sessions, NP  Encounter Date: 05/22/2017      PT End of Session - 05/22/17 1242    Visit Number 1   Number of Visits 1   Authorization Type Medicaid   PT Start Time 1140   PT Stop Time 1225   PT Time Calculation (min) 45 min   Activity Tolerance Patient tolerated treatment well   Behavior During Therapy [facility] for tasks assessed/performed      Past Medical History:  Diagnosis Date  . Chronic constipation   . Hypertension   . Obesity (BMI 30.0-34.9)   . Paranoid schizophrenia (HCC)   . Stroke [facility])     No past surgical history on file.  There were no vitals filed for this visit.       Subjective Assessment - 05/22/17 1146    Subjective Pt is a 56 y/o male who presents to OPPT for back pain, Lt shoulder pain, and lower extremity weakness.  Pt's sister helps supplement history due to expressive difficulties.  Pt with initial CVA in 2009 resulting in Lt sided weakness; then another CVA last year (? June 2017) with Rt sided weakness.  Pt and sister present today wanting mobility evaluation and sister reporting difficulty with providing care.   Patient is accompained by: Family member   Limitations Standing;Walking  scoot pivot transfers only   Patient Stated Goals improve strength   Currently in Pain? Yes   Pain Score 7    Pain Location Arm   Pain Orientation Left;Right   Pain Descriptors / Indicators Burning;Numbness   Pain Type Chronic pain   Pain Onset More than a month ago   Pain Frequency Constant   Aggravating Factors  temperature   Pain Relieving Factors tylenol   Multiple Pain Sites Yes   Pain Score 5   Pain Location Back   Pain Orientation Mid;Lower   Pain Descriptors /  Indicators Tightness   Pain Type Chronic pain   Pain Onset More than a month ago   Pain Frequency Intermittent   Aggravating Factors  unknown   Pain Relieving Factors unknown            [facility] PT Assessment - 05/22/17 1153      Assessment   Medical Diagnosis ankle, foot, shoulder pain   Referring Provider Grayce Sessions, NP   Onset Date/Surgical Date --  2009   Hand Dominance Right   Next MD Visit to schedule after today   Prior Therapy n/a     Precautions   Precautions Fall     Restrictions   Weight Bearing Restrictions No     Balance Screen   Has the patient fallen in the past 6 months Yes   How many times? 2   Has the patient had a decrease in activity level because of a fear of falling?  Yes   Is the patient reluctant to leave their home because of a fear of falling?  Yes     Home Environment   Living Environment Private residence   Living Arrangements Other relatives  sister   Available Help at Discharge Family;Available 24 hours/day;Personal care attendant  in home care 2 hours/day   Type of Home House   Home Access Ramped entrance   Home Layout  One level   Home Equipment Bedside commode;Wheelchair - manual;Hospital bed     Prior Function   Level of Independence Other (comment);Needs assistance with transfers  total care   Vocation Unemployed   Leisure watch TV     Cognition   Overall Cognitive Status History of cognitive impairments - at baseline   Behaviors Other (comment)  slow to process     Posture/Postural Control   Posture/Postural Control Postural limitations   Postural Limitations Posterior pelvic tilt;Rounded Shoulders;Forward head;Decreased lumbar lordosis     ROM / Strength   AROM / PROM / Strength Strength     Strength   Overall Strength Deficits   Overall Strength Comments gross deficits bil; RUE weaker than LUE; LLE weaker than RLE     Transfers   Comments deferred at this time as pt soiled himself during eval; sister  reports only scoot pivot transfers            Objective measurements completed on examination: See above findings.          [facility] Adult PT Treatment/Exercise - 05/22/17 1153      Self-Care   Self-Care Other Self-Care Comments   Other Self-Care Comments  provided UE HEP for posture and pre mobility exercises; long discussion with pt and sister about goals of eval and long term care.  Pt's sister wanting placement for short-term rehab so educated on process and recommendation for SW consult from PCP.  Pt's sister reports she will follow up with them about referral.  Recommended PACE, but must be 56 y/o to qualify.                PT Education - 05/22/17 1242    Education provided Yes   Education Details see self care   Person(s) Educated Patient;Caregiver(s)  sister-primary caregiver   Methods Explanation;Demonstration;Handout   Comprehension Verbalized understanding;Returned demonstration                     Plan - 05/22/17 1243    Clinical Impression Statement Pt presents to OPPT with c/o bil shoulder pain and back pain due to immobility and weakness.  Pt with significant PMH resulting in needing total care at this time for transfers and ADLs.  Pt's sister is primary caregiver and at this time is overwhelmed and worried about long term care for him.  She was under impression that eval today was for long term rehab at facility.  Educated on our goals, and that he doesn't have a qualifying diagnosis for additional visits through his insurance and is unable to proceed as self pay; and he has significant difficulty getting to appointments.  Recommend SW consult to help with placement options.   History and Personal Factors relevant to plan of care: CVA x 2, schizophrenia   Clinical Presentation Evolving   Clinical Presentation due to: declining function x 1 year   Clinical Decision Making Moderate   Rehab Potential Fair   PT Frequency One time visit   PT  Treatment/Interventions ADLs/Self Care Home Management;Neuromuscular re-education;Therapeutic exercise;Balance training;Functional mobility training;Therapeutic activities;Patient/family education;DME Instruction;Wheelchair mobility training   PT Next Visit Plan one time visit   Recommended Other Services social work/case manager   Consulted and Agree with Plan of Care Patient      Patient will benefit from skilled therapeutic intervention in order to improve the following deficits and impairments:  Decreased strength, Pain, Postural dysfunction  Visit Diagnosis: Chronic left shoulder pain - Plan: PT plan of care  cert/re-cert  Chronic right shoulder pain - Plan: PT plan of care cert/re-cert  Chronic midline low back pain without sciatica - Plan: PT plan of care cert/re-cert     Problem List There are no active problems to display for this patient.     Clarita Crane, PT, DPT 05/22/17 12:53 PM    [facility] [address] [place], [place], [address] Phone: [phone]   Fax:  [phone]  Name: Jared Preston MRN: [id number] Date of Birth: 12-05-1960

## 2017-05-22 NOTE — Patient Instructions (Signed)
Active / Assistive ROM Flexion With Static Hold Above Head    Clasp hands in lap. Slowly raise straight arms toward ceiling. Try to keep shoulder blades together. Hold _1-2__ seconds. Repeat __10-20_ times. Do _1-2__ sessions per day.  Active ROM in Horizontal Abduction / Adduction    Holding arms out in front, hands clasped, move arms out to one side then return. Repeat to other side. Keep arms parallel to floor. Repeat _10-20__ times. Do _1-2__ sessions per day.   PRESSURE RELIEF: Depression    Lock wheelchair brakes. Push down with arms on armrests or wheels to raise hips up. Hold _1-2__ seconds. Perform 10-20 reps.  Copyright  VHI. All rights reserved.

## 2018-06-13 ENCOUNTER — Ambulatory Visit: Payer: Medicaid Other | Attending: Nurse Practitioner | Admitting: Physical Therapy

## 2018-06-13 DIAGNOSIS — I69351 Hemiplegia and hemiparesis following cerebral infarction affecting right dominant side: Secondary | ICD-10-CM | POA: Diagnosis present

## 2018-06-13 DIAGNOSIS — M25511 Pain in right shoulder: Secondary | ICD-10-CM | POA: Diagnosis present

## 2018-06-13 DIAGNOSIS — I69354 Hemiplegia and hemiparesis following cerebral infarction affecting left non-dominant side: Secondary | ICD-10-CM

## 2018-06-13 DIAGNOSIS — M6281 Muscle weakness (generalized): Secondary | ICD-10-CM | POA: Insufficient documentation

## 2018-06-13 DIAGNOSIS — M25512 Pain in left shoulder: Secondary | ICD-10-CM | POA: Insufficient documentation

## 2018-06-13 DIAGNOSIS — M545 Low back pain: Secondary | ICD-10-CM | POA: Diagnosis present

## 2018-06-13 DIAGNOSIS — G8929 Other chronic pain: Secondary | ICD-10-CM | POA: Insufficient documentation

## 2018-06-13 DIAGNOSIS — R278 Other lack of coordination: Secondary | ICD-10-CM

## 2018-06-13 NOTE — Therapy (Signed)
[facility] [address] [place], [place], [address] Phone: [phone]   Fax:  [phone]  Physical Therapy Evaluation  Patient Details  Name: Jared Preston MRN: [id number] Date of Birth: 1960/10/28 Referring Provider (PT): Lilyan Gilford   Encounter Date: 06/13/2018  PT End of Session - 06/13/18 1516    Visit Number  1    Number of Visits  17    Authorization Type  Medicaid awaiting cont. Berkley Harvey    PT Start Time  1315    PT Stop Time  1405    PT Time Calculation (min)  50 min    Equipment Utilized During Treatment  Gait belt    Activity Tolerance  Patient tolerated treatment well;No increased pain    Behavior During Therapy  WFL for tasks assessed/performed       Past Medical History:  Diagnosis Date  . Chronic constipation   . Hypertension   . Obesity (BMI 30.0-34.9)   . Paranoid schizophrenia (HCC)   . Stroke [facility])     No past surgical history on file.  There were no vitals filed for this visit.   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  sits all day;     How long can you stand comfortably?  unable     How long can you walk comfortably?  non ambulatory    Patient Stated Goals  improve transfers and strength    Currently in Pain?  Yes    Pain Score  4     Pain Location  Shoulder    Pain Orientation  Right    Pain Descriptors / Indicators  Aching    Pain Type  Chronic pain    Pain Onset  More than a month ago    Pain Frequency  Intermittent    Aggravating Factors   dressing; raising arm     Pain Relieving Factors  rest arm        Weight 263.5;  Weighed in w/c and then w/c was 40.5 lbs  [facility] PT Assessment - 06/13/18 0001      Assessment   Medical Diagnosis  CVA 09  CVA 2017    Referring Provider (PT)  Lilyan Gilford    Onset Date/Surgical Date  05/29/18    Hand Dominance  Right      Precautions  Precautions  Fall;Other (comment)   skin integrity buttocks     Home Environment   Living Environment  Private residence    Living Arrangements  Other relatives    Available Help at Discharge  Family    Type of Home  House    Home Layout  One level    Home Equipment  Tub bench;Wheelchair - manual    Additional Comments  ramped access      Prior Function   Level of Independence  Needs assistance with ADLs;Needs assistance with homemaking;Needs assistance with transfers      Cognition   Overall Cognitive Status  History of cognitive impairments - at baseline    Attention  Selective      Observation/Other Assessments   Skin Integrity  --   sister report hx of ischial tuberosity skin breakdown     Coordination   Gross Motor Movements are Fluid and Coordinated  No    Fine Motor Movements are Fluid and Coordinated  No      Posture/Postural Control   Posture/Postural Control  Postural limitations    Postural Limitations  Flexed trunk;Rounded Shoulders;Posterior pelvic tilt      Tone   Assessment Location  Right Upper  Extremity;Right Lower Extremity      ROM / Strength   AROM / PROM / Strength  PROM      PROM   Overall PROM   --   right UE WFL     Ambulation/Gait   Ambulation/Gait  No      Wheelchair Mobility   Wheelchair Mobility  Yes    Wheelchair Propulsion  Left upper extremity;Right lower extremity      Balance   Balance Assessed  Yes      Static Sitting Balance   Static Sitting - Level of Assistance  4: Min assist      RUE Tone   RUE Tone  Flaccid      RLE Tone   RLE Tone  Flaccid                Objective measurements completed on examination: See above findings.              PT Education - 06/13/18 1633    Education Details  demonstrated tech, using gait belt for partical stand pivot transfer    Person(s) Educated  Patient;Caregiver(s)    Methods  Explanation;Demonstration    Comprehension  Verbalized understanding;Need further instruction       PT Short Term Goals - 06/13/18 1648      PT SHORT TERM GOAL #1   Title  Patient and caregiver will demonstrate a safe and effective stand pivot transfer with appropriate equipment that will mimize shearing force on the buttocks    Baseline  Current technique has poor body mechanics and significant shear force on buttocks.      Time  4    Period  Weeks    Status  New    Target Date  07/11/18      PT SHORT TERM GOAL #2   Title  Patient will be albe to lean forward and do self pressure relief in seated position as well as practice forward weight shift for tranfers.     Baseline  Patient is not doing and regular pressure relief for skin integrity on buttocks    Time  2    Period  Weeks    Status  New    Target Date  06/27/18  PT SHORT TERM GOAL #3   Title  Caregiver ( sister) and paitent will be able to successful to a safe stand pivot transfer from w/c to bed/toliet/tub bench    Baseline  current tech does not use good body mechancis, set up , gait belt; high for injury for sister and the patient      Time  4    Period  Weeks    Status  New    Target Date  06/27/18      PT SHORT TERM GOAL #4   Title  Patient will engage in a daily home exericse program designed to improve core contorl and sitting balance    Baseline  No home exercise program being followed and pt requires support when sitting     Time  4    Period  Weeks    Status  New    Target Date  06/27/18        PT Long Term Goals - 06/13/18 1655      PT LONG TERM GOAL #1   Title  Patient will be albe to do a safe slide board transfer w/o sheer force on buttocks    Baseline  Pt requires total assist for transfers    Time  8    Period  Weeks    Status  New    Target Date  08/22/18      PT LONG TERM GOAL #2   Title  Patient will be fitted and obtain a properly fitting wheelchair with effective presure reduction system.     Baseline  current wheelchair is completely inappropriate    Time  8    Period  Weeks    Status  New    Target Date  08/22/18      PT LONG TERM GOAL #3   Title  Paitent will be able to self propel wheelchair for ADL's in the home     Baseline  Unable to propel wheelchair at all     Time  8    Period  Weeks    Status  New    Target Date  08/22/18             Plan - 06/13/18 1642    Rehab Potential  Fair    Clinical Impairments Affecting Rehab Potential  hemiparesis; skin integrity; obese; equipement needs     PT Frequency  2x / week    PT Duration  8 weeks    PT Treatment/Interventions  ADLs/Self Care Home Management;Therapeutic exercise;Therapeutic activities;Functional mobility training;Balance training;Patient/family education;Wheelchair mobility training;Passive range of motion;Other (comment)    PT Next Visit Plan  begin discussion about w/c needs; address skin integrity / pressure reduction; focus on safe transfers with sister    PT Home Exercise Plan  will need to develop a seated and supine HEP    Recommended Other Services  needs a wheelchair; may do OT referral as well     Consulted and Agree with Plan of Care  Patient;Family member/caregiver    Family Member Consulted  sister       Patient will benefit from skilled therapeutic intervention in order to improve the following deficits and impairments:  Decreased cognition, Decreased skin integrity, Improper body mechanics, Decreased coordination, Pain, Decreased mobility, Impaired tone, Postural dysfunction, Impaired UE functional use, Decreased strength, Decreased endurance, Decreased activity tolerance, Decreased balance, Obesity  Visit Diagnosis: Hemiplegia and hemiparesis following cerebral infarction affecting left non-dominant side (HCC) - Plan: PT plan of care cert/re-cert  Hemiplegia and hemiparesis following cerebral infarction affecting right dominant side (HCC) - Plan: PT plan of care cert/re-cert  Muscle weakness (generalized) - Plan: PT plan of care cert/re-cert  Other lack of coordination - Plan: PT plan of care cert/re-cert     Problem List There are no active problems to display for this patient.   Cecilie Kicks 06/13/2018, 5:05 PM  [facility] [address] [place], [place], [address] Phone: [phone]   Fax:  [phone]  Name: AIDIAN SALOMON MRN: [id number] Date of Birth: 09-18-1960

## 2018-06-20 ENCOUNTER — Ambulatory Visit: Payer: Medicaid Other | Admitting: Physical Therapy

## 2018-06-20 ENCOUNTER — Encounter: Payer: Self-pay | Admitting: Physical Therapy

## 2018-06-20 DIAGNOSIS — I69354 Hemiplegia and hemiparesis following cerebral infarction affecting left non-dominant side: Secondary | ICD-10-CM

## 2018-06-20 DIAGNOSIS — M25511 Pain in right shoulder: Secondary | ICD-10-CM

## 2018-06-20 DIAGNOSIS — M6281 Muscle weakness (generalized): Secondary | ICD-10-CM

## 2018-06-20 DIAGNOSIS — I69351 Hemiplegia and hemiparesis following cerebral infarction affecting right dominant side: Secondary | ICD-10-CM

## 2018-06-20 DIAGNOSIS — G8929 Other chronic pain: Secondary | ICD-10-CM

## 2018-06-20 DIAGNOSIS — R278 Other lack of coordination: Secondary | ICD-10-CM

## 2018-06-20 NOTE — Patient Instructions (Signed)
  PRESSURE RELIEF: Depression    Lock wheelchair brakes. Push down with arms on armrests or wheels to raise hips up. Hold _15__ seconds. Repeat every  5 ___ minutes. Do as often as possible during the day;  If you are watching TV do as many times as possible during commercials.  Copyright  VHI. All rights reserved.   PRESSURE RELIEF: Forward  PRESSURE RELIEF: Side to Side    Lock wheelchair brakes. Lean to side as far as possible to un-weight buttocks. Repeat to opposite side. Hold __5_ seconds. Repeat every _5__ minutes. Wear seat belt across waist.  Copyright  VHI. All rights reserved.    Lock wheelchair brakes. Lean forward as far as possible to un-weight buttocks. Reach for toes. Hold ___ seconds. Repeat every ___ minutes. Wear seat belt across waist.  Copyright  VHI. All rights reserved.

## 2018-06-20 NOTE — Therapy (Signed)
[facility] [address] [place], [place], [address] Phone: [phone]   Fax:  [phone]  Physical Therapy Treatment  Patient Details  Name: Jared Preston MRN: [id number] Date of Birth: July 29, 1961 Referring Provider (PT): Lilyan Gilford   Encounter Date: 06/20/2018  PT End of Session - 06/20/18 1633    Visit Number  2    Number of Visits  17    Authorization Type  Medicaid awaiting cont. auth    PT Start Time  1530    PT Stop Time  1615    PT Time Calculation (min)  45 min    Equipment Utilized During Treatment  Gait belt;Other (comment)    Activity Tolerance  Patient tolerated treatment well    Behavior During Therapy  [facility] for tasks assessed/performed       Past Medical History:  Diagnosis Date  . Chronic constipation   . Hypertension   . Obesity (BMI 30.0-34.9)   . Paranoid schizophrenia (HCC)   . Stroke [facility])     History reviewed. No pertinent surgical history.  There were no vitals filed for this visit.  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 right side; schizophrenia; obese  (Pended)     How long can you sit comfortably?  sits all day;   (Pended)     How long can you stand comfortably?  unable   (Pended)     How long can you walk comfortably?  non ambulatory  (Pended)     Patient Stated Goals  improve transfers and strength;  get a better w/c  (Pended)                        [facility] Adult PT Treatment/Exercise - 06/20/18 0001      Transfers   Transfers  Lateral/Scoot Transfers    Lateral/Scoot Transfers  3: Mod assist    Lateral/Scoot Transfer Details (indicate cue type and reason)  using transfer board and weight shift tech     Comments  much  safer and with proper weight shift minmal sheer force on buttocks      Exercises   Exercises  Other Exercises    Other Exercises   --   see seatd weight shifting and pressure relief ex's         On the mat ; several side lying on elbow to sit transfers and leaning forward and pressing up- hands on yoga block - for pressure relief of buttocks     PT Education - 06/20/18 1629    Education Details  transfer board transfer; and weight shifing ex's  ; sent home pressure relief exericse print out  and instruction to measure his door openings to help with ordering proper w/o    Person(s) Educated  Patient    Methods  Tactile cues;Verbal cues;Handout;Explanation;Demonstration    Comprehension  Returned demonstration;Verbalized understanding       PT Short Term Goals - 06/13/18 1648      PT SHORT TERM GOAL #1   Title  Patient and caregiver will demonstrate a safe and effective stand pivot transfer with appropriate equipment that will mimize shearing force on  the buttocks    Baseline  Current technique has poor body mechanics and significant shear force on buttocks.      Time  4    Period  Weeks    Status  New    Target Date  07/11/18      PT SHORT TERM GOAL #2   Title  Patient will be albe to lean forward and do self pressure relief in seated position as well as practice forward weight shift for tranfers.     Baseline  Patient is not doing and regular pressure relief for skin integrity on buttocks    Time  2    Period  Weeks    Status  New    Target Date  06/27/18      PT SHORT TERM GOAL #3   Title  Caregiver ( sister) and paitent will be able to successful to a safe stand pivot transfer from w/c to bed/toliet/tub bench    Baseline  current tech does not use good body mechancis, set up , gait belt; high for injury for sister and the patient     Time  4    Period  Weeks    Status  New    Target Date  06/27/18      PT SHORT TERM GOAL #4   Title  Patient will engage in a daily  home exericse program designed to improve core contorl and sitting balance    Baseline  No home exercise program being followed and pt requires support when sitting     Time  4    Period  Weeks    Status  New    Target Date  06/27/18        PT Long Term Goals - 06/13/18 1655      PT LONG TERM GOAL #1   Title  Patient will be albe to do a safe slide board transfer w/o sheer force on buttocks    Baseline  Pt requires total assist for transfers    Time  8    Period  Weeks    Status  New    Target Date  08/22/18      PT LONG TERM GOAL #2   Title  Patient will be fitted and obtain a properly fitting wheelchair with effective presure reduction system.     Baseline  current wheelchair is completely inappropriate    Time  8    Period  Weeks    Status  New    Target Date  08/22/18      PT LONG TERM GOAL #3   Title  Paitent will be able to self propel wheelchair for ADL's in the home     Baseline  Unable to propel wheelchair at all     Time  8    Period  Weeks    Status  New    Target Date  08/22/18            Plan - 06/20/18 1634    Clinical Impression Statement  Pt did well with the pressure relief tech and this helped with slide board transfer; it was much safer that the stand pivot we tired initally; he does not have one at home ; he needs a proper w/c with pressure relief seating system; need home measurments before we can accuratlely request a chair    Clinical Presentation  Evolving    Rehab Potential  Fair    Clinical Impairments Affecting Rehab Potential  hemiparesis;  skin integrity; obese; equipement needs     PT Frequency  2x / week    PT Duration  8 weeks    PT Treatment/Interventions  ADLs/Self Care Home Management;Therapeutic exercise;Therapeutic activities;Functional mobility training;Balance training;Patient/family education;Wheelchair mobility training;Passive range of motion;Other (comment)    PT Next Visit Plan  review pressure reduction ex's; do slide  board transfers again; if he has home measurements get wheel chair ordering process started     PT Home Exercise Plan  cont with pressure reduction ex's     Consulted and Agree with Plan of Care  Patient       Patient will benefit from skilled therapeutic intervention in order to improve the following deficits and impairments:  Decreased cognition, Decreased skin integrity, Improper body mechanics, Decreased coordination, Pain, Decreased mobility, Impaired tone, Postural dysfunction, Impaired UE functional use, Decreased strength, Decreased endurance, Decreased activity tolerance, Decreased balance, Obesity  Visit Diagnosis: Hemiplegia and hemiparesis following cerebral infarction affecting left non-dominant side (HCC)  Hemiplegia and hemiparesis following cerebral infarction affecting right dominant side (HCC)  Muscle weakness (generalized)  Other lack of coordination  Chronic right shoulder pain     Problem List There are no active problems to display for this patient.   Vashti Hey D PT DPT 06/20/2018, 4:39 PM  [facility] [address] [place], [place], [address] Phone: [phone]   Fax:  [phone]  Name: MANOLITO JUREWICZ MRN: [id number] Date of Birth: 12/04/1960

## 2018-06-27 ENCOUNTER — Ambulatory Visit: Payer: Medicaid Other | Admitting: Physical Therapy

## 2018-06-27 DIAGNOSIS — M6281 Muscle weakness (generalized): Secondary | ICD-10-CM

## 2018-06-27 DIAGNOSIS — I69354 Hemiplegia and hemiparesis following cerebral infarction affecting left non-dominant side: Secondary | ICD-10-CM

## 2018-06-27 DIAGNOSIS — M545 Low back pain, unspecified: Secondary | ICD-10-CM

## 2018-06-27 DIAGNOSIS — R278 Other lack of coordination: Secondary | ICD-10-CM

## 2018-06-27 DIAGNOSIS — M25511 Pain in right shoulder: Secondary | ICD-10-CM

## 2018-06-27 DIAGNOSIS — M25512 Pain in left shoulder: Secondary | ICD-10-CM

## 2018-06-27 DIAGNOSIS — I69351 Hemiplegia and hemiparesis following cerebral infarction affecting right dominant side: Secondary | ICD-10-CM

## 2018-06-27 DIAGNOSIS — G8929 Other chronic pain: Secondary | ICD-10-CM

## 2018-06-27 NOTE — Therapy (Signed)
[facility] [address] [place], [place], [address] Phone: [phone]   Fax:  [phone]  Physical Therapy Treatment  Patient Details  Name: Jared Preston MRN: [id number] Date of Birth: 02-Mar-1961 Referring Provider (PT): Lilyan Gilford   Encounter Date: 06/27/2018  PT End of Session - 06/27/18 1635    Visit Number  3    Number of Visits  17    Authorization Type  Medicaid awaiting cont. auth  have 1 more visit approved then will need to request additional visits     PT Start Time  1530    PT Stop Time  1615    PT Time Calculation (min)  45 min    Equipment Utilized During Treatment  Gait belt    Activity Tolerance  Patient tolerated treatment well    Behavior During Therapy  WFL for tasks assessed/performed       Past Medical History:  Diagnosis Date  . Chronic constipation   . Hypertension   . Obesity (BMI 30.0-34.9)   . Paranoid schizophrenia (HCC)   . Stroke [facility])     No past surgical history on file.  There were no vitals filed for this visit.  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  sits all day;     How long can you stand comfortably?  unable     Patient Stated Goals  improve transfers and strength    Currently in Pain?  No/denies                       [facility] Adult PT Treatment/Exercise - 06/27/18 0001      Transfers   Transfers  Lateral/Scoot Transfers    Lateral/Scoot Transfers  4: Min guard   just needed to set up the slide board and he did it  sba/mina   Lateral/Scoot Transfer Details (indicate cue type and reason)  slide board        THer ex;; seated on mat;  Sit to side lying x 20 each way;  Leaning forward then up into seated extension with hands clasped together and trying to reach up to ceiling x 5 min   Sitting in w/c   W/c push ups x 20     PT Education - 06/27/18 1634    Education Details  recommending decrease in sitting time on his toiliet ; stressing importance of pressure relief throughout the day and working on weight shifting and eventually ind transfers;  he needs a slide board at home as well. cont education on process of getting a w/c    Person(s) Educated  Patient    Methods  Demonstration;Verbal cues;Tactile cues    Comprehension  Returned demonstration       PT Short Term Goals - 06/13/18 1648      PT SHORT TERM GOAL #1   Title  Patient and caregiver will demonstrate a safe and effective stand pivot transfer with  appropriate equipment that will mimize shearing force on the buttocks    Baseline  Current technique has poor body mechanics and significant shear force on buttocks.      Time  4    Period  Weeks    Status  New    Target Date  07/11/18      PT SHORT TERM GOAL #2   Title  Patient will be albe to lean forward and do self pressure relief in seated position as well as practice forward weight shift for tranfers.     Baseline  Patient is not doing and regular pressure relief for skin integrity on buttocks    Time  2    Period  Weeks    Status  New    Target Date  06/27/18      PT SHORT TERM GOAL #3   Title  Caregiver ( sister) and paitent will be able to successful to a safe stand pivot transfer from w/c to bed/toliet/tub bench    Baseline  current tech does not use good body mechancis, set up , gait belt; high for injury for sister and the patient     Time  4    Period  Weeks    Status  New    Target Date  06/27/18      PT SHORT TERM GOAL #4   Title  Patient will engage in a daily home  exericse program designed to improve core contorl and sitting balance    Baseline  No home exercise program being followed and pt requires support when sitting     Time  4    Period  Weeks    Status  New    Target Date  06/27/18        PT Long Term Goals - 06/13/18 1655      PT LONG TERM GOAL #1   Title  Patient will be albe to do a safe slide board transfer w/o sheer force on buttocks    Baseline  Pt requires total assist for transfers    Time  8    Period  Weeks    Status  New    Target Date  08/22/18      PT LONG TERM GOAL #2   Title  Patient will be fitted and obtain a properly fitting wheelchair with effective presure reduction system.     Baseline  current wheelchair is completely inappropriate    Time  8    Period  Weeks    Status  New    Target Date  08/22/18      PT LONG TERM GOAL #3   Title  Paitent will be able to self propel wheelchair for ADL's in the home     Baseline  Unable to propel wheelchair at all     Time  8    Period  Weeks    Status  New    Target Date  08/22/18            Plan - 06/27/18 1636    Clinical Impression Statement  he has already made significant progress in trunk control and unsupported sitting ; the slide board transfer was 90% on his power today and just needed help with set up; he brought a rudimentary measurement we can use to get a w/c order started; should be ok with a 22" chair high ; need to measure for depth and height ; needs light weight with swing away arm rests and  swing away leg rests and anti tip bars; and a pressure relief cushion     Clinical Presentation  Evolving    Rehab Potential  Fair    Clinical Impairments Affecting Rehab Potential  hemiparesis; skin integrity; obese; equipment needs     PT Treatment/Interventions  ADLs/Self Care Home Management;Therapeutic exercise;Therapeutic activities;Functional mobility training;Balance training;Patient/family education;Wheelchair mobility training;Passive range of  motion;Other (comment)    PT Next Visit Plan  get w/c order started and see about getting him a slide board;; requested for sister to be present to help get better clarity on home enviroment; cont to work on pressure relief ex's     PT Home Exercise Plan  slide board transfer and posture ex's / seated     Consulted and Agree with Plan of Care  Patient       Patient will benefit from skilled therapeutic intervention in order to improve the following deficits and impairments:  Decreased cognition, Decreased skin integrity, Improper body mechanics, Decreased coordination, Pain, Decreased mobility, Impaired tone, Postural dysfunction, Impaired UE functional use, Decreased strength, Decreased endurance, Decreased activity tolerance, Decreased balance, Obesity  Visit Diagnosis: Hemiplegia and hemiparesis following cerebral infarction affecting left non-dominant side (HCC)  Hemiplegia and hemiparesis following cerebral infarction affecting right dominant side (HCC)  Muscle weakness (generalized)  Other lack of coordination  Chronic right shoulder pain  Chronic left shoulder pain  Chronic midline low back pain without sciatica     Problem List There are no active problems to display for this patient.   Vashti Hey D PT DPT  06/27/2018, 4:47 PM  [facility] [address] [place], [place], [address] Phone: [phone]   Fax:  [phone]  Name: NICKOLAS CHALFIN MRN: [id number] Date of Birth: 1961-01-30

## 2018-07-04 ENCOUNTER — Ambulatory Visit: Payer: Medicaid Other | Attending: Nurse Practitioner | Admitting: Physical Therapy

## 2018-07-04 ENCOUNTER — Encounter: Payer: Self-pay | Admitting: Physical Therapy

## 2018-07-04 DIAGNOSIS — R278 Other lack of coordination: Secondary | ICD-10-CM

## 2018-07-04 DIAGNOSIS — I69351 Hemiplegia and hemiparesis following cerebral infarction affecting right dominant side: Secondary | ICD-10-CM | POA: Diagnosis present

## 2018-07-04 DIAGNOSIS — I69354 Hemiplegia and hemiparesis following cerebral infarction affecting left non-dominant side: Secondary | ICD-10-CM

## 2018-07-04 DIAGNOSIS — M6281 Muscle weakness (generalized): Secondary | ICD-10-CM

## 2018-07-04 NOTE — Therapy (Addendum)
[facility] [address] [place], [place], [address] Phone: [phone]   Fax:  [phone]  Physical Therapy Treatment  Patient Details  Name: Jared Preston MRN: [id number] Date of Birth: 03-29-1961 Referring Provider (PT): Aura Camps   Encounter Date: 07/04/2018  PT End of Session - 07/04/18 1536    Visit Number  4    Number of Visits  17    Authorization Type  Medicaid awaiting cont. Josem Kaufmann      PT Start Time  1532    PT Stop Time  1615    PT Time Calculation (min)  43 min    Equipment Utilized During Treatment  Gait belt    Activity Tolerance  Patient tolerated treatment well    Behavior During Therapy  WFL for tasks assessed/performed       Past Medical History:  Diagnosis Date  . Chronic constipation   . Hypertension   . Obesity (BMI 30.0-34.9)   . Paranoid schizophrenia ([place])   . Stroke [facility])     History reviewed. No pertinent surgical history.  There were no vitals filed for this visit.  Subjective Assessment - 07/04/18 1535    Subjective  has some pain in his left shoulder, otherwise doing well. Continues to spend most of his day either in bed or [facility]    Pertinent History  CVA 09' left side affected  CVA 2017?? right side; schizophrenia; obese    Patient Stated Goals  improve transfers and strength    Currently in Pain?  Yes    Pain Score  7     Pain Location  Shoulder    Pain Orientation  Left    Pain Descriptors / Indicators  Aching    Pain Type  Chronic pain                       [facility] Adult PT Treatment/Exercise - 07/04/18 0001      Transfers   Transfers  Lateral/Scoot Transfers    Lateral/Scoot Transfers  With slide board    Lateral/Scoot Transfer Details (indicate cue type and reason)  requires Max A for board placement and setup; able to perform transfer with cueing for hand placement and sequencing with general Min g/Min A for safety and stability    Comments   attempted sit to stand at // bars x 3 today for hopeful greater ease of stand/pivot transfers - patient unable to clear buttocks without total A      Therapeutic Activites    Therapeutic Activities  Other Therapeutic Activities    Other Therapeutic Activities  R <> L lateral scooting at EOM focusing on weight shifting, UE pushing and foot placement; pressure relief in w/c 3 x 15 seconds - patient using B UE on arm rests with hands much posterior to trunk (likely going to increase shoulder pain with this)             PT Education - 07/04/18 1723    Education Details  education on need for increased mobility thoughout day to reduce pressure, hope for improved mobility; discussion on need for slide board    Person(s) Educated  Patient    Methods  Explanation    Comprehension  Verbalized understanding;Need further instruction       PT Short Term Goals - 07/04/18 1538      PT SHORT TERM GOAL #1   Title  Patient and caregiver will demonstrate a safe and effective stand  pivot transfer with appropriate equipment that will mimize shearing force on the buttocks    Baseline  caregiver not present at PT treatment session; patient reports he continues to do slide transfers    Time  4    Period  Weeks    Status  Not Met      PT SHORT TERM GOAL #2   Title  Patient will be albe to lean forward and do self pressure relief in seated position as well as practice forward weight shift for tranfers.     Baseline  able to position LE and UE at w/c level to perform pressure reflief in 15 sec increments - is not in w/c everyday    Time  2    Period  Weeks    Status  Achieved      PT SHORT TERM GOAL #3   Title  Caregiver ( sister) and paitent will be able to successful to a safe stand pivot transfer from w/c to bed/toliet/tub bench    Baseline  patient reports he has done no standing at home for transfers    Time  4    Period  Weeks    Status  Not Met      PT SHORT TERM GOAL #4   Title  Patient  will engage in a daily home exericse program designed to improve core contorl and sitting balance    Baseline  reports he has not done HEP due to not transferring to w/c recently.    Time  4    Period  Weeks    Status  Not Met        PT Long Term Goals - 07/04/18 1724      PT LONG TERM GOAL #1   Title  Patient will be albe to do a safe slide board transfer with Mod I w/o sheer force on buttocks    Baseline  07/04/18: able to do with Max set up for board placement with Min A from PT for initial lateral scoot - patient performing 90% of transfer without assist    Time  8    Period  Weeks    Status  New      PT LONG TERM GOAL #2   Title  Patient will be fitted and obtain a properly fitting wheelchair with effective presure reduction system.     Baseline  current w/c too low and not deep enough given patients body habitus; currently discussing with family on need for MD referral for w/c order/fitting    Time  8    Period  Weeks    Status  New      PT LONG TERM GOAL #3   Title  Paitent will be able to self propel wheelchair for ADL's in the home     Baseline  07/04/18: able to propel w/c with R foot for ~20 feet - requires close supervision    Time  8    Period  Weeks    Status  New      PT LONG TERM GOAL #4   Title  Patient to demonstrate sit to stand at parallel bars with Min A for boost into standing with patient to stand for up to 3 min for greater ease of stand pivot transfers    Baseline  07/04/18: unable/requires total assist    Time  8    Period  Weeks    Status  New    Target Date  08/22/18  PT LONG TERM GOAL #5   Title  Patient to perform stand pivot transfer with sister in the home for reduced fall risk/safety and increased functional independence.     Baseline  07/04/18: patient performs posterior/lateral scoot transfers to w/c and [facility]     Time  8    Period  Weeks    Status  New    Target Date  08/22/18            Plan - 07/04/18 1729    Clinical  Impression Statement  Patient presenting to OPPT with complaints of L shoulder pain, however, very receptive to performing all tasks asked of him in clinic. STG assessment today with patient not meeting goals likely due to limited number of visits in a short span of time. Patient reports he continues to perform posterio/lateral scoot transfers to w/c and [facility] with patient declining stand pivot transfers in the home, likely due to caregiver burden. Emphasized need for slide board to more efficiently and safely perform transfers. Is able to perform slide board transfers in clinic with overall Min A, but does require assist for board placement and setup. Attempted to stand at // bars today with patient requiring total A and unable to perform. Will recommend continued PT to allow for improved safety with functional mobility and reduced caregiver burden.     Rehab Potential  Fair    Clinical Impairments Affecting Rehab Potential  hemiparesis; skin integrity; obese; equipment needs     PT Treatment/Interventions  ADLs/Self Care Home Management;Therapeutic exercise;Therapeutic activities;Functional mobility training;Balance training;Patient/family education;Wheelchair mobility training;Passive range of motion;Other (comment)    PT Next Visit Plan  get w/c order started and see about getting him a slide board;; requested for sister to be present to help get better clarity on home enviroment; cont to work on pressure relief ex's     PT Home Exercise Plan  slide board transfer and posture ex's / seated     Consulted and Agree with Plan of Care  Patient       Patient will benefit from skilled therapeutic intervention in order to improve the following deficits and impairments:  Decreased cognition, Decreased skin integrity, Improper body mechanics, Decreased coordination, Pain, Decreased mobility, Impaired tone, Postural dysfunction, Impaired UE functional use, Decreased strength, Decreased endurance, Decreased  activity tolerance, Decreased balance, Obesity  Visit Diagnosis: Hemiplegia and hemiparesis following cerebral infarction affecting left non-dominant side (HCC)  Hemiplegia and hemiparesis following cerebral infarction affecting right dominant side (HCC)  Muscle weakness (generalized)  Other lack of coordination     Problem List There are no active problems to display for this patient.    Lanney Gins, PT, DPT Supplemental Physical Therapist 07/04/18 5:39 PM Pager: [phone] Office: [place] [address] [place], [place], [address] Phone: [phone]   Fax:  [phone]  Name: DEVION CHRISCOE MRN: [id number] Date of Birth: [birth year]  PHYSICAL THERAPY DISCHARGE SUMMARY  Visits from Start of Care: 4  Current functional level related to goals / functional outcomes: See above; difficulty with all functional mobility; unable to stand at // bars; required assist for slide board placement and set-up; heavy focus on education on need for increased compliance with HEP   Remaining deficits: See above   Education / Equipment: HEP  Plan: Patient agrees to discharge.  Patient goals were not met. Patient is being discharged due to not returning since the last visit.  ?????     Colletta [place]  Zerita Boers, PT, DPT Supplemental Physical Therapist 10/10/18 1:17 PM Pager: [phone] Office: [phone]

## 2018-10-19 ENCOUNTER — Encounter: Payer: Self-pay | Admitting: Gastroenterology

## 2018-11-09 ENCOUNTER — Other Ambulatory Visit: Payer: Self-pay

## 2018-11-09 ENCOUNTER — Encounter: Payer: Self-pay | Admitting: Gastroenterology

## 2018-11-09 ENCOUNTER — Other Ambulatory Visit (INDEPENDENT_AMBULATORY_CARE_PROVIDER_SITE_OTHER): Payer: Medicaid Other

## 2018-11-09 ENCOUNTER — Ambulatory Visit (INDEPENDENT_AMBULATORY_CARE_PROVIDER_SITE_OTHER): Payer: Medicaid Other | Admitting: Physician Assistant

## 2018-11-09 VITALS — BP 90/60 | HR 72

## 2018-11-09 DIAGNOSIS — R109 Unspecified abdominal pain: Secondary | ICD-10-CM

## 2018-11-09 DIAGNOSIS — K5900 Constipation, unspecified: Secondary | ICD-10-CM | POA: Diagnosis not present

## 2018-11-09 DIAGNOSIS — R140 Abdominal distension (gaseous): Secondary | ICD-10-CM | POA: Diagnosis not present

## 2018-11-09 LAB — CBC WITH DIFFERENTIAL/PLATELET
BASOS ABS: 0.1 10*3/uL (ref 0.0–0.1)
Basophils Relative: 0.7 % (ref 0.0–3.0)
Eosinophils Absolute: 0.1 10*3/uL (ref 0.0–0.7)
Eosinophils Relative: 1.1 % (ref 0.0–5.0)
HEMATOCRIT: 39.9 % (ref 39.0–52.0)
HEMOGLOBIN: 13.3 g/dL (ref 13.0–17.0)
LYMPHS PCT: 21.7 % (ref 12.0–46.0)
Lymphs Abs: 2 10*3/uL (ref 0.7–4.0)
MCHC: 33.3 g/dL (ref 30.0–36.0)
MCV: 93.8 fl (ref 78.0–100.0)
MONOS PCT: 4.9 % (ref 3.0–12.0)
Monocytes Absolute: 0.5 10*3/uL (ref 0.1–1.0)
Neutro Abs: 6.7 10*3/uL (ref 1.4–7.7)
Neutrophils Relative %: 71.6 % (ref 43.0–77.0)
Platelets: 218 10*3/uL (ref 150.0–400.0)
RBC: 4.26 Mil/uL (ref 4.22–5.81)
RDW: 13.8 % (ref 11.5–15.5)
WBC: 9.4 10*3/uL (ref 4.0–10.5)

## 2018-11-09 MED ORDER — NA SULFATE-K SULFATE-MG SULF 17.5-3.13-1.6 GM/177ML PO SOLN: 1.0000 | ORAL | 0 refills | Status: DC

## 2018-11-09 MED ORDER — NA SULFATE-K SULFATE-MG SULF 17.5-3.13-1.6 GM/177ML PO SOLN: 1.0000 | ORAL | 0 refills | Status: AC

## 2018-11-09 NOTE — Progress Notes (Signed)
Subjective:    Patient ID: Jared Preston, male    DOB: [birth year], 58 y.o.   MRN: [id number]  HPI Jared Preston is a pleasant 58 year old African-American male, new to GI today referred by [facility]/Monica Kelly-Coleman NP for evaluation of chronic constipation, complaints of abdominal pain, and bloating.  He has not had any prior GI evaluation.  His sister states that he has been disabled since 2014 when he had a CVA and has been wheelchair-bound since that time. She says he has been having problems with constipation at least as far back as 2014.  They had been on multiple different over-the-counter medicines over the past years with various success.  His sister says she has given him suppositories and enemas as well.  She feels the symptoms have been worse over the past several months.  She feels that he has difficulty bearing down to have a bowel movement and she says sometimes she pushes on his abdomen in order to help him have a bowel movement.  Patient is not a great historian but when asked whether he has abdominal pain says that he hurts on his left abdomen most of the time.  She says his appetite is very good, he is actually gained weight.  No complaints of nausea or vomiting.  She has not noticed any bleeding.  He may have a bowel movement 2-3 times per week.  Sometimes he passes very large hard stools other times will pass loose stool. Currently she is giving him a "cocktail" of Dulcolax 2 or 3 tablets, 2 or 3 Linzess 290 mcg tablets lactulose 30 to 60 cc.  She will give this all at the same time every couple of days when she feels he needs it. History is negative for colon cancer and polyps as far as they are aware. Recent C-Met from February 2020 unremarkable except for alk phos of 140   Review of Systems Pertinent positive and negative review of systems were noted in the above HPI section.  All other review of systems was otherwise negative.  Outpatient Encounter Medications as of  11/09/2018  Medication Sig  . ARIPiprazole (ABILIFY) 9.75 MG/1.3ML injection Inject 9.75 mg into the muscle every 30 (thirty) days.  . bisacodyl (DULCOLAX) 5 MG EC tablet Take 5 mg by mouth daily as needed for moderate constipation (3 tablets at a time per his sister).  . hydrochlorothiazide (HYDRODIURIL) 50 MG tablet Take 50 mg by mouth daily.   Marland Kitchen ibuprofen (ADVIL,MOTRIN) 200 MG tablet Take 200 mg by mouth 2 (two) times daily as needed for moderate pain.   . Lactase (LACTOSE FAST ACTING RELIEF PO) Take by mouth.  . linaclotide (LINZESS) 290 MCG CAPS capsule Take 290 mcg by mouth daily before breakfast.  . potassium chloride SA (K-DUR,KLOR-CON) 20 MEQ tablet Take 20 mEq by mouth 2 (two) times daily.   . Na Sulfate-K Sulfate-Mg Sulf 17.5-3.13-1.6 GM/177ML SOLN Take 1 kit by mouth as directed for 30 days.  . [DISCONTINUED] AMITIZA 24 MCG capsule Take 24 mg by mouth 2 (two) times daily.  . [DISCONTINUED] cholecalciferol (VITAMIN D) 1000 UNITS tablet Take 1,000 Units by mouth 2 (two) times daily.   . [DISCONTINUED] Na Sulfate-K Sulfate-Mg Sulf 17.5-3.13-1.6 GM/177ML SOLN Take 1 kit by mouth as directed for 30 days.  . [DISCONTINUED] polyethylene glycol powder (GLYCOLAX/MIRALAX) powder Take 17 g by mouth 2 (two) times daily. (Patient not taking: Reported on 05/22/2017)   No facility-administered encounter medications on file as of 11/09/2018.  No Known Allergies There are no active problems to display for this patient.  Social History   Socioeconomic History  . Marital status: Single    Spouse name: Not on file  . Number of children: 0  . Years of education: some hs  . Highest education level: Not on file  Occupational History  . Occupation: N/A  Social Needs  . Financial resource strain: Not on file  . Food insecurity:    Worry: Not on file    Inability: Not on file  . Transportation needs:    Medical: Not on file    Non-medical: Not on file  Tobacco Use  . Smoking status: Current  Every Day Smoker    Types: Cigarettes  . Smokeless tobacco: Never Used  Substance and Sexual Activity  . Alcohol use: Yes    Alcohol/week: 0.0 standard drinks  . Drug use: No  . Sexual activity: Never  Lifestyle  . Physical activity:    Days per week: Not on file    Minutes per session: Not on file  . Stress: Not on file  Relationships  . Social connections:    Talks on phone: Not on file    Gets together: Not on file    Attends religious service: Not on file    Active member of club or organization: Not on file    Attends meetings of clubs or organizations: Not on file    Relationship status: Not on file  . Intimate partner violence:    Fear of current or ex partner: Not on file    Emotionally abused: Not on file    Physically abused: Not on file    Forced sexual activity: Not on file  Other Topics Concern  . Not on file  Social History Narrative   Drinks coffee 3 times a week     [name]'s family history includes COPD in his maternal uncle; Cancer in his maternal uncle; Congestive Heart Failure in his mother; Diabetes in his maternal uncle; Diverticulitis in his maternal grandmother and mother.      Objective:    Vitals:   11/09/18 0858  BP: 90/60  Pulse: 72    Physical Exam; well-developed older African-American male in no acute distress, pleasant Patient is in a wheelchair most of the history is offered by his sister who accompanies him.  Blood pressure 90/60, pulse 72,, height and weight not obtained today/wheelchair-bound.  HEENT; nontraumatic normocephalic EOMI PERRLA sclera anicteric mucosa moist, Cardiovascular; regular rate and rhythm with S1-S2.  Pulmonary; clear bilaterally, Abdomen; soft, nondistended bowel sounds are active, there is no palpable mass or hepatosplenomegaly, there is no focal tenderness.  Rectal ;exam not done.  Extremities ;no clubbing cyanosis or edema he does have some muscular atrophy bilaterally, decrease use of the right upper extremity.   Neuropsych ;alert, oriented and affect appropriate       Assessment & Plan:   #86 58 year old African-American male with chronic severe constipation likely functional and at least in part secondary to immobility and patient wheelchair-bound post CVA.  #2 status post CVA 2014 #3.  History of urinary tract infections #4 colon cancer screening-no prior screening  Plan; We will check KUB flat and upright today to assess degree of obstipation.  If patient is severely constipated will give him a bowel purge. In the interim have asked his sister to place him on a regular daily regimen of 2 Dulcolax tablets at bedtime, Linzess 290  micrograms once daily, and have asked her  to start MiraLAX 17 g in 8 ounces of water twice daily. She can use lactulose on a as needed basis. Check CBC. Patient will be scheduled for colonoscopy with Dr. Tarri Glenn, at the hospital due to wheelchair status.  Patient sister believes she can help him prep at home.  Procedure was discussed in detail with the patient and sister including indications, risks and benefits and he is agreeable to proceed.  Ahmari Duerson S Makynleigh Breslin PA-C 11/09/2018   Cc: No ref. provider found

## 2018-11-09 NOTE — Patient Instructions (Signed)
You have been scheduled for a colonoscopy. Please follow written instructions given to you at your visit today.  Please pick up your prep supplies at the pharmacy within the next 1-3 days. If you use inhalers (even only as needed), please bring them with you on the day of your procedure. Your physician has requested that you go to www.startemmi.com and enter the access code given to you at your visit today. This web site gives a general overview about your procedure. However, you should still follow specific instructions given to you by our office regarding your preparation for the procedure.  Your provider has requested that you have an abdominal x ray before leaving today. Please go to the basement floor to our Radiology department for the test.  Your provider has requested that you go to the basement level for lab work before leaving today. Press "B" on the elevator. The lab is located at the first door on the left as you exit the elevator.  Take two dulcolax every day at bedtime.   Give 2 doses of Miralax everyday in 8 oz of water.   Take Linzess 290 mcg once daily at bedtime.

## 2018-11-10 NOTE — Progress Notes (Signed)
Reviewed. I agree with documentation including the assessment and plan.  Quan Cybulski L. Lonnie Reth, MD, MPH 

## 2018-11-13 ENCOUNTER — Telehealth: Payer: Self-pay | Admitting: Gastroenterology

## 2018-11-13 DIAGNOSIS — R109 Unspecified abdominal pain: Secondary | ICD-10-CM

## 2018-11-13 DIAGNOSIS — K5900 Constipation, unspecified: Secondary | ICD-10-CM

## 2018-11-13 NOTE — Telephone Encounter (Signed)
Pt saw Jared Preston on 3-13  Pt sister called said that the patient was not able to do the x-ray on 3-13 because she forgot when they left. She just called labs where she needed to do the x-ray and was told that they can not do it here due to him being in a wheelchair and will have to have the x-ray done at the hospital.  PT sister would like to know if he needs the x-ray done before the procedure on 4-21 and where to get it done.

## 2018-11-14 NOTE — Telephone Encounter (Signed)
Confirmed with scheduling the patient can walk in for the KUB at [facility]  Sister will arrange transportation through the county for next [place]. New order put in for [facility].

## 2018-11-15 ENCOUNTER — Telehealth: Payer: Self-pay | Admitting: Emergency Medicine

## 2018-11-15 NOTE — Telephone Encounter (Signed)
Thank you :)

## 2018-11-15 NOTE — Telephone Encounter (Signed)
Spoke with patients brother Jared Preston he will inform his sister Jared Preston who accompanies him to his appointments. He expressed understanding and gratitude for keeping his brothers risk of exposure low. Will place recall to reschedule him to next available hospital week.

## 2018-11-15 NOTE — Telephone Encounter (Signed)
Given current recommendations to cancel all non-emergent procedure due to Covid19, I recommend that this endoscopy be rescheduled. Thank you.

## 2018-11-15 NOTE — Telephone Encounter (Signed)
Dr. Beavers,   This patient is scheduled for a procedure at the hospital on 12-18-2018. Due to COVID19 we are rescheduling patients who are not urgent at this time. Please review patients chart and let me know if I need to make any adjustments to patients appointment.    Thanks!  

## 2018-11-21 ENCOUNTER — Other Ambulatory Visit: Payer: Self-pay

## 2018-11-21 ENCOUNTER — Ambulatory Visit (HOSPITAL_COMMUNITY)
Admission: RE | Admit: 2018-11-21 | Discharge: 2018-11-21 | Disposition: A | Discharge status: Home / Self Care | Payer: Medicaid Other | Source: Ambulatory Visit | Attending: Physician Assistant | Admitting: Physician Assistant

## 2018-11-21 DIAGNOSIS — R109 Unspecified abdominal pain: Secondary | ICD-10-CM | POA: Insufficient documentation

## 2018-11-21 DIAGNOSIS — K5900 Constipation, unspecified: Secondary | ICD-10-CM | POA: Diagnosis not present

## 2018-12-18 ENCOUNTER — Encounter (HOSPITAL_COMMUNITY): Payer: Self-pay

## 2018-12-18 ENCOUNTER — Ambulatory Visit (HOSPITAL_COMMUNITY): Admit: 2018-12-18 | Payer: Medicaid Other | Admitting: Gastroenterology

## 2018-12-18 SURGERY — COLONOSCOPY WITH PROPOFOL
Anesthesia: Monitor Anesthesia Care

## 2019-05-06 IMAGING — DX ABDOMEN - 2 VIEW
3 series · 4 of 4 positions shown · non-contrast
Comparison: 08/26/2016

CLINICAL DATA: Left-sided abdominal pain

EXAM:
ABDOMEN - 2 VIEW

[Series 1: abdomen erect · 0.14mm/px · 2 of 2 slices shown]
[im 1/2]
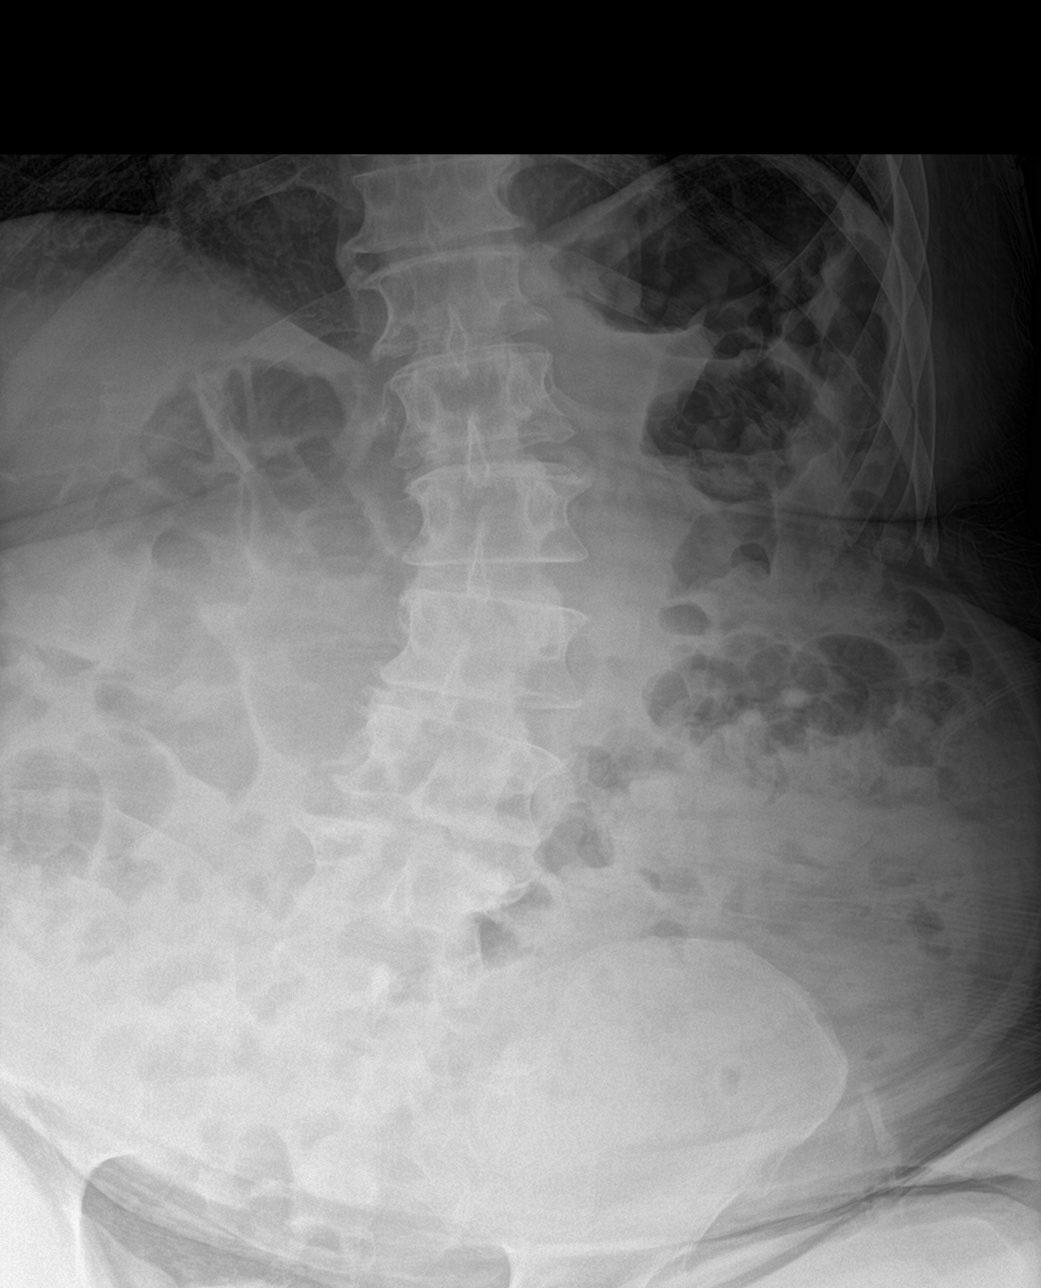
[im 2/2]
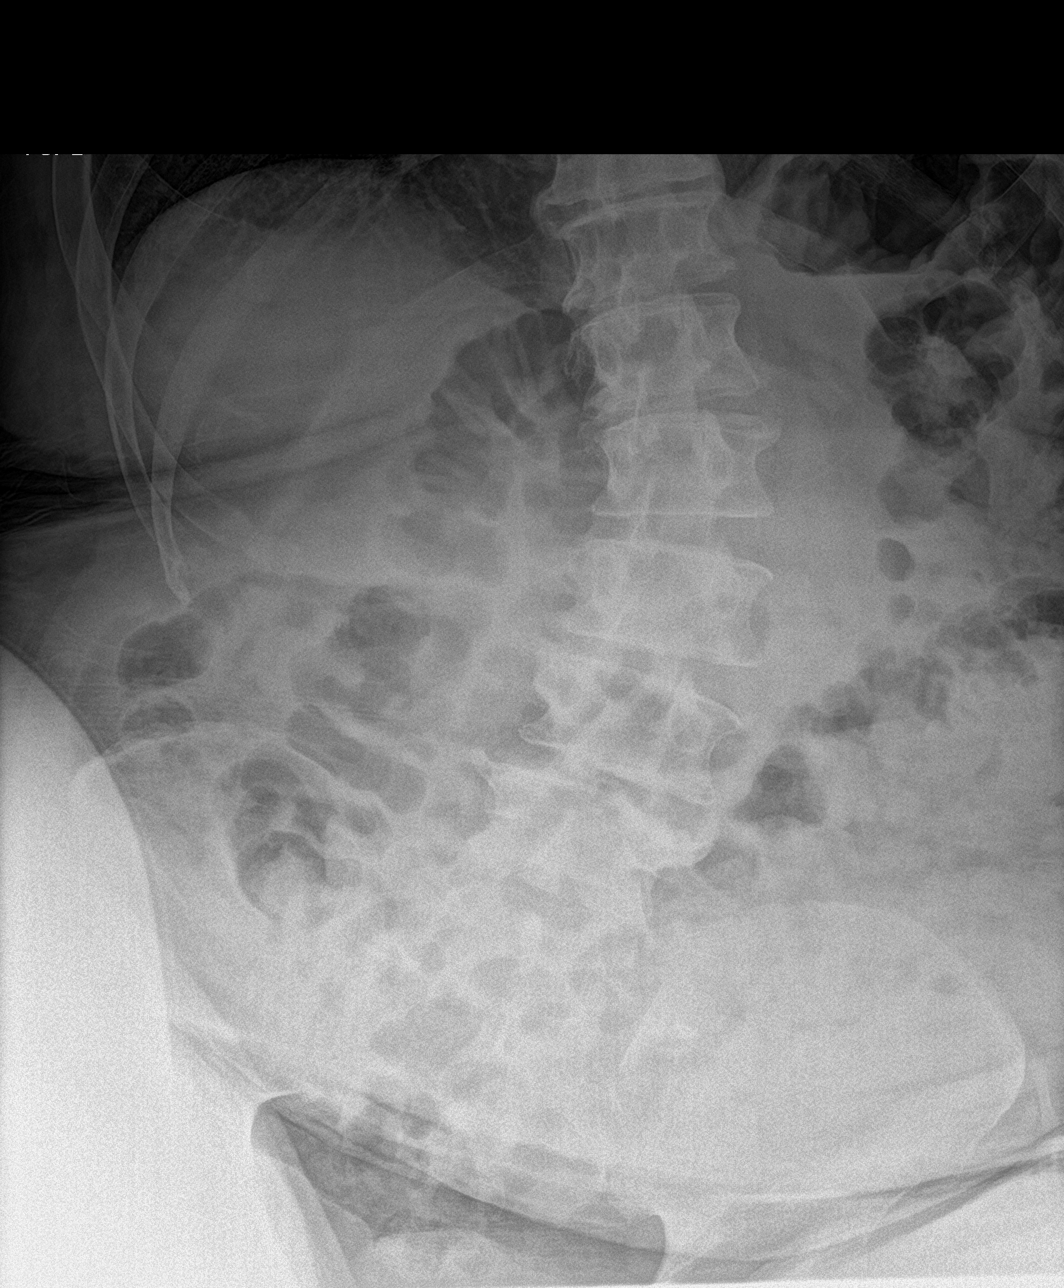

[abdomen supine (1 of 2)]
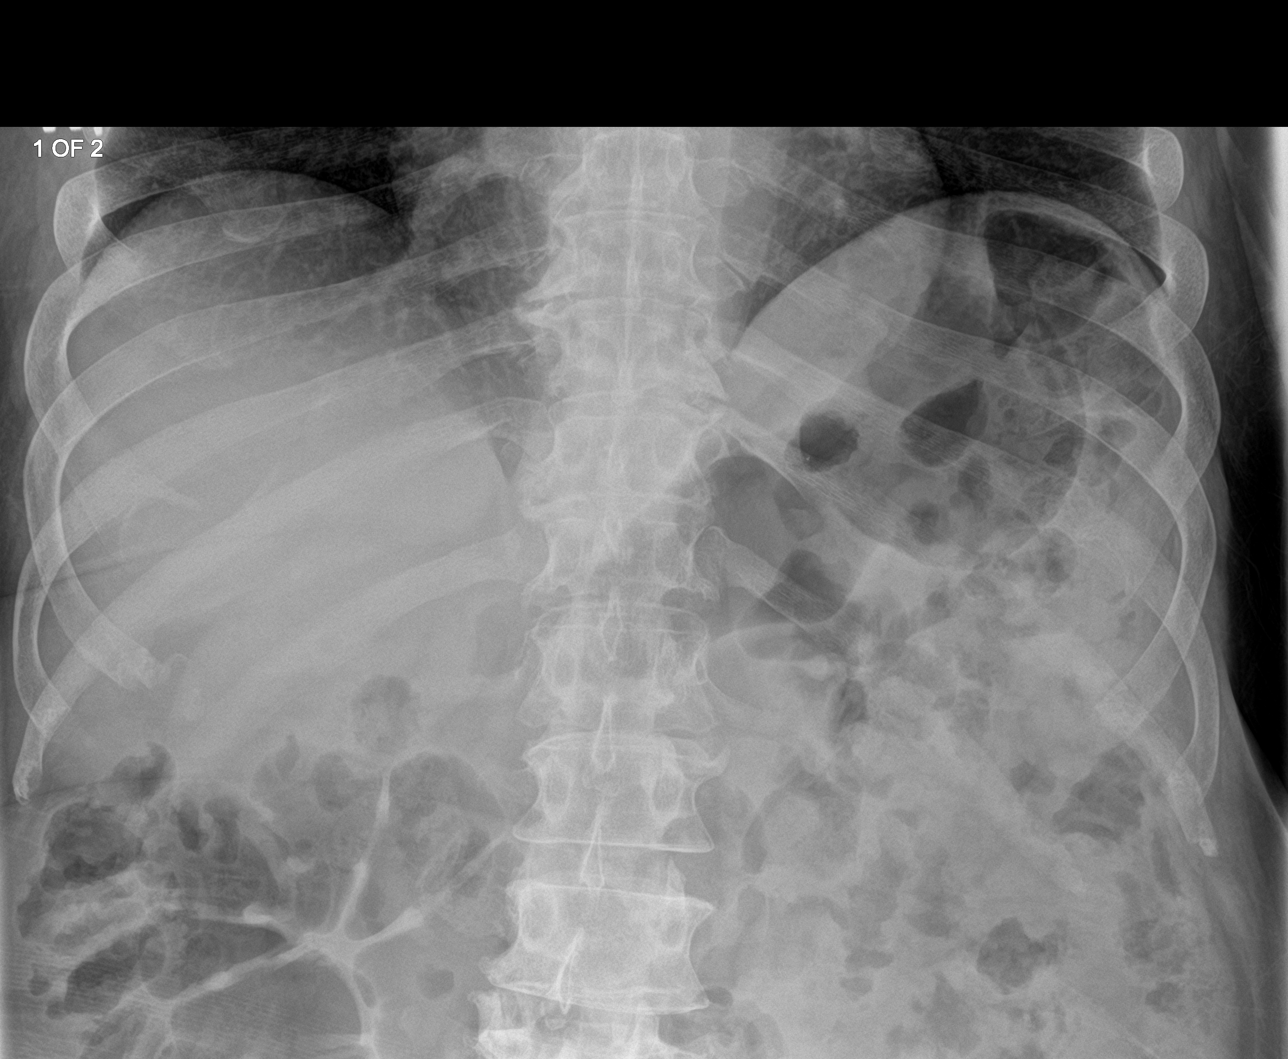

[abdomen supine (2 of 2)]
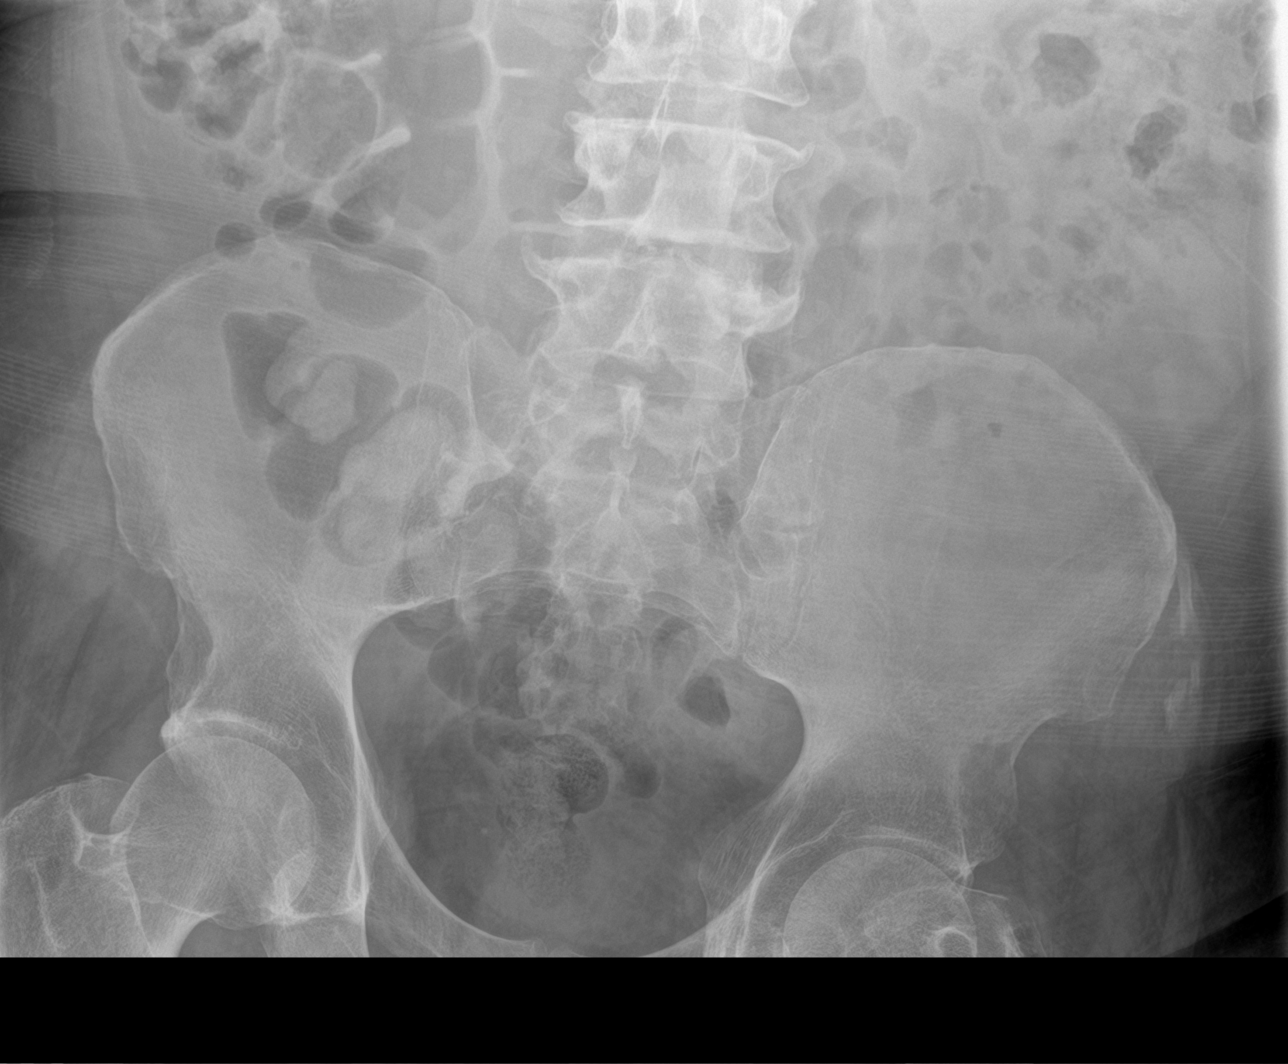

[4 of 4 positions shown; findings below may reference images not displayed]

FINDINGS: Scattered large and small bowel gas is noted. No obstructive changes
are seen. No free air is noted. No abnormal mass or abnormal
calcifications are noted. Degenerative changes of lumbar spine are
seen.
IMPRESSION: No acute abnormality noted.

## 2019-12-11 ENCOUNTER — Ambulatory Visit (INDEPENDENT_AMBULATORY_CARE_PROVIDER_SITE_OTHER): Payer: Medicaid Other | Admitting: Primary Care

## 2019-12-11 ENCOUNTER — Other Ambulatory Visit: Payer: Self-pay

## 2019-12-11 ENCOUNTER — Encounter (INDEPENDENT_AMBULATORY_CARE_PROVIDER_SITE_OTHER): Payer: Self-pay | Admitting: Primary Care

## 2019-12-11 VITALS — BP 142/83 | HR 48 | Temp 97.3°F | Ht 74.0 in

## 2019-12-11 DIAGNOSIS — I10 Essential (primary) hypertension: Secondary | ICD-10-CM

## 2019-12-11 DIAGNOSIS — K5900 Constipation, unspecified: Secondary | ICD-10-CM

## 2019-12-11 DIAGNOSIS — I69352 Hemiplegia and hemiparesis following cerebral infarction affecting left dominant side: Secondary | ICD-10-CM

## 2019-12-11 DIAGNOSIS — Z7689 Persons encountering health services in other specified circumstances: Secondary | ICD-10-CM | POA: Diagnosis not present

## 2019-12-11 DIAGNOSIS — I69354 Hemiplegia and hemiparesis following cerebral infarction affecting left non-dominant side: Secondary | ICD-10-CM

## 2019-12-11 DIAGNOSIS — Z13220 Encounter for screening for lipoid disorders: Secondary | ICD-10-CM

## 2019-12-11 DIAGNOSIS — Z720 Tobacco use: Secondary | ICD-10-CM

## 2019-12-11 DIAGNOSIS — L84 Corns and callosities: Secondary | ICD-10-CM

## 2019-12-11 MED ORDER — SORBITOL 70 % PO SOLN: 15.0000 mL | Freq: Every day | ORAL | 0 refills | Status: DC | PRN

## 2019-12-11 NOTE — Progress Notes (Signed)
New Patient Office Visit  Subjective:  Patient ID: Jared Preston, male    DOB: 25-Feb-1961  Age: 59 y.o. MRN: [id number]  CC:  Chief Complaint  Patient presents with  . New Patient (Initial Visit)    HPI Jared Preston is a 59 year old African American male presents today to establish care. He is wll known from a different site. Patient has had a stroke on the left side , unable to ambulate, incontinent of bowel and bladder wears briefs. He can feed his self with little assistance but need care with all ADL/s and IADLs. He is able to communicate his needs and wants.   Past Medical History:  Diagnosis Date  . Chronic constipation   . Hypertension   . Obesity (BMI 30.0-34.9)   . Paranoid schizophrenia ([place])   . Stroke [facility])     No past surgical history on file.  Family History  Problem Relation Age of Onset  . Congestive Heart Failure Mother   . Diverticulitis Mother   . COPD Maternal Uncle   . Diabetes Maternal Uncle   . Cancer Maternal Uncle   . Diverticulitis Maternal Grandmother     Social History   Socioeconomic History  . Marital status: Single    Spouse name: Not on file  . Number of children: 0  . Years of education: some hs  . Highest education level: Not on file  Occupational History  . Occupation: N/A  Tobacco Use  . Smoking status: Current Every Day Smoker    Types: Cigarettes  . Smokeless tobacco: Never Used  Substance and Sexual Activity  . Alcohol use: Yes    Alcohol/week: 0.0 standard drinks  . Drug use: No  . Sexual activity: Never  Other Topics Concern  . Not on file  Social History Narrative   Drinks coffee 3 times a week    Social Determinants of Health   Financial Resource Strain:   . Difficulty of Paying Living Expenses:   Food Insecurity:   . Worried About Charity fundraiser in the Last Year:   . Arboriculturist in the Last Year:   Transportation Needs:   . Film/video editor (Medical):   Marland Kitchen Lack of Transportation  (Non-Medical):   Physical Activity:   . Days of Exercise per Week:   . Minutes of Exercise per Session:   Stress:   . Feeling of Stress :   Social Connections:   . Frequency of Communication with Friends and Family:   . Frequency of Social Gatherings with Friends and Family:   . Attends Religious Services:   . Active Member of Clubs or Organizations:   . Attends Archivist Meetings:   Marland Kitchen Marital Status:   Intimate Partner Violence:   . Fear of Current or Ex-Partner:   . Emotionally Abused:   Marland Kitchen Physically Abused:   . Sexually Abused:     ROS Review of Systems  HENT: Positive for dental problem.        Missing teeth   Genitourinary: Positive for urgency.  Musculoskeletal: Positive for gait problem.       Muscle spasm back legs   Neurological: Positive for speech difficulty.  All other systems reviewed and are negative.   Objective:   Today's Vitals: BP (!) 142/83 (BP Location: Right Arm, Patient Position: Sitting, Cuff Size: Large)   Pulse (!) 48   Temp (!) 97.3 F (36.3 C) (Temporal)   Ht 6\' 2"  (1.88  m)   SpO2 100%   BMI 34.54 kg/m   Physical Exam Vitals reviewed.  Constitutional:      Appearance: Normal appearance. He is obese.  HENT:     Head: Normocephalic.     Nose: Nose normal.  Eyes:     Extraocular Movements: Extraocular movements intact.  Cardiovascular:     Rate and Rhythm: Normal rate and regular rhythm.  Pulmonary:     Effort: Pulmonary effort is normal.     Breath sounds: Normal breath sounds.  Abdominal:     General: Bowel sounds are normal.  Musculoskeletal:     Cervical back: Normal range of motion.     Comments: Unable to perform ROM lower extremities Able to lift his left arm to a 45 degree angle    Skin:    General: Skin is warm and dry.  Neurological:     Mental Status: He is alert and oriented to person, place, and time.     Motor: Weakness present.     Coordination: Coordination abnormal.     Gait: Gait abnormal.      Deep Tendon Reflexes: Reflexes abnormal.  Psychiatric:        Mood and Affect: Mood normal.     Assessment & Plan:  Jared Preston was seen today for new patient (initial visit).  Diagnoses and all orders for this visit:  Encounter to establish care Gwinda Passe, NP-C will be your  (PCP) she is mastered prepared . She is skilled to diagnosed and treat illness. Also able to answer health concern as well as continuing care of varied medical conditions, not limited by cause, organ system, or diagnosis.   -     CBC with Differential -     Comprehensive metabolic panel -     Lipid Panel  Hemiplegia and hemiparesis following cerebral infarction affecting left non-dominant side (HCC) Followed by neurology   Hemiplegia and hemiparesis following cerebral infarction affecting left dominant side (HCC) Followed by neurology   Essential hypertension, benign Counseled on blood pressure goal of less than 130/80, low-sodium, DASH diet, medication compliance, 150 minutes of moderate intensity exercise per week. Discussed medication compliance, adverse effects. -     Comprehensive metabolic panel  Foot callus   Refer to [facility] Lipid screening  Constipation, unspecified constipation type  Tobacco abuse  Other orders -     sorbitol 70 % solution; Take 15 mLs by mouth daily as needed.    Outpatient Encounter Medications as of 12/11/2019  Medication Sig  . ARIPiprazole (ABILIFY) 9.75 MG/1.3ML injection Inject 9.75 mg into the muscle every 30 (thirty) days.  . bisacodyl (DULCOLAX) 5 MG EC tablet Take 5 mg by mouth daily as needed for moderate constipation (3 tablets at a time per his sister).  . hydrochlorothiazide (HYDRODIURIL) 50 MG tablet Take 50 mg by mouth daily.   Marland Kitchen ibuprofen (ADVIL,MOTRIN) 200 MG tablet Take 200 mg by mouth 2 (two) times daily as needed for moderate pain.   . Lactase (LACTOSE FAST ACTING RELIEF PO) Take by mouth.  . linaclotide (LINZESS) 290 MCG CAPS  capsule Take 290 mcg by mouth daily before breakfast.  . potassium chloride SA (K-DUR,KLOR-CON) 20 MEQ tablet Take 20 mEq by mouth 2 (two) times daily.    No facility-administered encounter medications on file as of 12/11/2019.    Follow-up: No follow-ups on file.   Grayce Sessions, NP

## 2019-12-11 NOTE — Patient Instructions (Signed)
8 Easy Exercises You Can Do Sitting Down  Got a chair? Then you're ready for this sit-down, total-body workout!.  Safety precaution: Pay attention to your body during the movements - if anything hurts or causes pain, stop immediately. And check with your doctor first before beginning this, or any, exercise program.   Sunshine Arm Circles Seated in a chair with good posture, hold a ball in both hands with arms extended above your head and/or in front of you, keeping elbows slightly bent. Visualizing the face of a clock out in front of you, begin by holding arms up overhead at 12 o'clock. Circle the ball around to go all the way around the clock in a controlled, fluid motion. When you've reached 12 o'clock again, reverse directions and circle the opposite way. Keep alternating circle directions for 8 repetitions. Rest. Do another set of 8 repetitions.  Modification: A ball is not required for this exercise. Imagine that you are holding a ball while performing the motion. If it is difficult to bring your arms overhead, extend them out in front of you and move arms as if drawing a circle on the wall with or without the ball.   Tummy Twists Seated in a chair with good posture, hold a ball with both hands close to the body, with elbows bent and pulled in close to the ribcage. Slowly rotate your torso to the right as far as you comfortably can, being sure to keep the rest of your body still and stable. Rotate back to the center and repeat in the opposite direction. Do this 8 times, with two twists counting as a full set. Rest. Do another 8 sets (two twists each). Modification: A ball is not required for this exercise. Imagine you are holding a ball while performing the motion, or hold a small object such as a can of soup or water bottle to add resistance   Ball Chest Press Seated in a chair with good posture, hold a ball with both hands at chest level, palms facing toward each other and  elbows bent. Avoid bending forward by keeping your shoulders back at all times. Squeeze the ball slightly as you push the ball away from you in a fluid motion, taking about 2 seconds to extend the arms. Squeeze your shoulder blades together as you pull the ball back toward your chest. Repeat the push and pull motion 10 to 15 times. Rest. Do another set of 10 to 15 repetitions.  Modification: For a greater challenge, add a Tai Chi feel by standing with one leg slightly in front of the other (with a chair nearby if needed for extra balance) and slowly rocking the entire body forward and back as you push the ball away and pull back in.     Front Arm Raises In a seated position with good posture, hold a ball in both hands with palms facing each other. Extend the arms out in front of your body, keeping your elbows slightly bent. Starting with the ball lowered toward the knees, slowly raise your arms to lift the ball up to shoulder level (no higher), then lower the ball back to the starting position, taking about 2 to 3 seconds to lift and lower. Repeat 10 to 15 times. Rest. Do another set of 10 to 15 repetitions. Modification: A ball is not required for this exercise. Imagine you are holding a ball as you perform the motion, or hold a small object, such as a can of soup or  water bottle for added resistance.        Inner Thigh Squeezes Sitting toward the edge of a chair with good posture and knees bent, place a ball in between your knees; press the knees together to squeeze the ball, taking about 1 to 2 seconds to squeeze. You should feel the resistance in your inner thighs. Slowly release, keeping slight tension on the ball so that it does not fall. Repeat 8 to 10 times. Rest. Do another set of 8 to 10 repetitions. Modification: For a greater challenge, change the count of the squeezes by squeezing the ball and holding for 5 seconds, then releasing again. Or, do short, quick pulsing  squeezes.     Knee Extensions Sitting toward the edge of a chair with good posture and bent knees, hold on to the sides of the chair with your hands. Extend the right knee out so that the toes come up toward the ceiling, being sure to keep the knee slightly bent without locking it through the entire movement. Lower the leg back to a bent position and repeat this movement 8 to 10 times, using about 2 seconds each to lift and lower the leg. Switch to the opposite leg and perform 8 to 10 repetitions. Rest briefly. Do another set of 8 to 10 repetitions for each leg. Modification: If you are more advanced, sitting in the same position as above, extend one leg out in front of you with toes pointed to the ceiling. Lift and lower the entire leg only as high as you comfortably can, keeping the knee slightly bent. The longer lever adds difficulty to the exercise.   Elbow to Knee Seated toward the edge of a chair with good posture and knees bent, start with your right arm extended up overhead. Slowly lift the left knee up as you lower your right elbow down toward your left knee, taking about 2 seconds to lower down. Try not to bend over at the waist. Release and go back to the starting position. Repeat 8 to 10 times. Switch sides and do 8 to 10 repetitions, pulling one elbow to the opposite knee. Rest. Do another set of 8 to 10 repetitions on each side. Modification: Try this (with a chair nearby for balance) exercise in a standing position for an increased range of motion.     Overhead Arm Extensions Seated in a chair with good posture, hold a ball with both hands and raise it up over your head, with arms extended without locking the elbows. Keeping the elbows pulled in toward the head, slowly bend the elbows to lower the ball down along the back of the neck, using about 2 seconds to go down, then 2 seconds to push the ball back up over your head. Repeat 8 to 10 times. Rest. Do another  set of 8 to 10 repetitions. Modification: Try seated tricep extensions (ball not required for this modification). Bending slightly forward with elbows tucked into your sides, slowly extend the elbows so that your forearms go back behind you, keeping the elbows pulled up and in for the entire movement. Return to the starting position and repeat. Hold soup cans or small weights for added resistance.

## 2019-12-13 ENCOUNTER — Other Ambulatory Visit (INDEPENDENT_AMBULATORY_CARE_PROVIDER_SITE_OTHER): Payer: Self-pay | Admitting: Primary Care

## 2019-12-13 LAB — CBC WITH DIFFERENTIAL/PLATELET
Basophils Absolute: 0 10*3/uL (ref 0.0–0.2)
Basos: 1 %
EOS (ABSOLUTE): 0.1 10*3/uL (ref 0.0–0.4)
Eos: 2 %
Hematocrit: 38.9 % (ref 37.5–51.0)
Hemoglobin: 13.4 g/dL (ref 13.0–17.7)
Immature Grans (Abs): 0 10*3/uL (ref 0.0–0.1)
Immature Granulocytes: 0 %
Lymphocytes Absolute: 1.9 10*3/uL (ref 0.7–3.1)
Lymphs: 27 %
MCH: 32.1 pg (ref 26.6–33.0)
MCHC: 34.4 g/dL (ref 31.5–35.7)
MCV: 93 fL (ref 79–97)
Monocytes Absolute: 0.4 10*3/uL (ref 0.1–0.9)
Monocytes: 6 %
Neutrophils Absolute: 4.5 10*3/uL (ref 1.4–7.0)
Neutrophils: 64 %
Platelets: 115 10*3/uL — ABNORMAL LOW (ref 150–450)
RBC: 4.18 x10E6/uL (ref 4.14–5.80)
RDW: 12.3 % (ref 11.6–15.4)
WBC: 7 10*3/uL (ref 3.4–10.8)

## 2019-12-13 LAB — COMPREHENSIVE METABOLIC PANEL
ALT: 17 IU/L (ref 0–44)
AST: 19 IU/L (ref 0–40)
Albumin/Globulin Ratio: 1.8 (ref 1.2–2.2)
Albumin: 4.1 g/dL (ref 3.8–4.9)
Alkaline Phosphatase: 128 IU/L — ABNORMAL HIGH (ref 39–117)
BUN/Creatinine Ratio: 12 (ref 9–20)
BUN: 8 mg/dL (ref 6–24)
Bilirubin Total: 0.3 mg/dL (ref 0.0–1.2)
CO2: 26 mmol/L (ref 20–29)
Calcium: 9.1 mg/dL (ref 8.7–10.2)
Chloride: 105 mmol/L (ref 96–106)
Creatinine, Ser: 0.65 mg/dL — ABNORMAL LOW (ref 0.76–1.27)
GFR calc Af Amer: 126 mL/min/{1.73_m2} (ref >59)
GFR calc non Af Amer: 109 mL/min/{1.73_m2} (ref >59)
Globulin, Total: 2.3 g/dL (ref 1.5–4.5)
Glucose: 84 mg/dL (ref 65–99)
Potassium: 4.5 mmol/L (ref 3.5–5.2)
Sodium: 140 mmol/L (ref 134–144)
Total Protein: 6.4 g/dL (ref 6.0–8.5)

## 2019-12-13 LAB — LIPID PANEL
Chol/HDL Ratio: 2.7 ratio (ref 0.0–5.0)
Cholesterol, Total: 114 mg/dL (ref 100–199)
HDL: 42 mg/dL (ref >39)
LDL Chol Calc (NIH): 62 mg/dL (ref 0–99)
Triglycerides: 37 mg/dL (ref 0–149)
VLDL Cholesterol Cal: 10 mg/dL (ref 5–40)

## 2019-12-19 ENCOUNTER — Telehealth (INDEPENDENT_AMBULATORY_CARE_PROVIDER_SITE_OTHER): Payer: Self-pay

## 2019-12-19 NOTE — Telephone Encounter (Signed)
Patient called to inform that sorbitol 70 % solution Was not covered by [facility] and it is to expensive to pay out of pocket. Patient states he also had an appointment with [facility] on Monday the 12 before coming to establish care with Jared Preston and was advice he he had a UTI and was told to pick up medication that was sent to his pharmacy. Patient wanted to inform PCP in regards to UTI.  Please advice [phone]

## 2019-12-19 NOTE — Telephone Encounter (Signed)
-----   Message from Grayce Sessions, NP sent at 12/17/2019 10:04 AM EDT ----- Reviewed labs are normal

## 2019-12-19 NOTE — Telephone Encounter (Signed)
Patient was unavailable. Results given to his sister. She is aware that labs are normal. She will inform patient. Maryjean Morn, CMA

## 2019-12-20 NOTE — Telephone Encounter (Signed)
Sent to PCP ?

## 2019-12-21 NOTE — Telephone Encounter (Signed)
Patient can buy any generic laxative MANAGEMENT OF CHRONIC CONSTIPATION  Drink fluids in the recommended amount everyday. Recommend amount is 8 cups of water daily. Do not replace water with Gatorade or Powerade as these should only be used when you are dehydrated.  Eat lots of high fiber foods-fruits, veggies, bran and whole grain instead of white bread Be active everyday. Inactivity makes constipation worse. Add psyllium daily (Metamucil) which comes in capsules now. Start very low dose and work up to recommended dose on bottle daily. Stay away from Milk of Magnesia or any magnesium containing laxative, unless you need it to clear things out rarely. It is an addictive laxative and your gut will become dependent on it. If that is not working, I would start Miralax, which you can buy in generic 17 gms daily. It's a powder and not an "addictive laxative". Take it every day and titrate the dose up or down to get the daily Bm. We will consider the use of other pharmacological treatments should the above recommendations prove to be unsuccessful.

## 2019-12-23 NOTE — Telephone Encounter (Signed)
Left message asking patient or sister to return call to  [facility] at [phone].

## 2019-12-25 NOTE — Telephone Encounter (Signed)
Patient sister is aware of advise per PCP.

## 2020-03-11 ENCOUNTER — Ambulatory Visit (INDEPENDENT_AMBULATORY_CARE_PROVIDER_SITE_OTHER): Payer: Medicaid Other | Admitting: Primary Care

## 2020-03-24 ENCOUNTER — Encounter (INDEPENDENT_AMBULATORY_CARE_PROVIDER_SITE_OTHER): Payer: Self-pay | Admitting: Primary Care

## 2020-03-24 ENCOUNTER — Ambulatory Visit (INDEPENDENT_AMBULATORY_CARE_PROVIDER_SITE_OTHER): Payer: Medicaid Other | Admitting: Primary Care

## 2020-03-24 ENCOUNTER — Other Ambulatory Visit: Payer: Self-pay

## 2020-03-24 VITALS — BP 97/64 | HR 54 | Temp 98.0°F | Ht 74.0 in

## 2020-03-24 DIAGNOSIS — Z23 Encounter for immunization: Secondary | ICD-10-CM | POA: Diagnosis not present

## 2020-03-24 DIAGNOSIS — I10 Essential (primary) hypertension: Secondary | ICD-10-CM

## 2020-03-24 DIAGNOSIS — Z0000 Encounter for general adult medical examination without abnormal findings: Secondary | ICD-10-CM | POA: Diagnosis not present

## 2020-03-24 DIAGNOSIS — Z114 Encounter for screening for human immunodeficiency virus [HIV]: Secondary | ICD-10-CM

## 2020-03-24 DIAGNOSIS — Z1159 Encounter for screening for other viral diseases: Secondary | ICD-10-CM

## 2020-03-24 DIAGNOSIS — I69354 Hemiplegia and hemiparesis following cerebral infarction affecting left non-dominant side: Secondary | ICD-10-CM | POA: Diagnosis not present

## 2020-03-24 NOTE — Progress Notes (Signed)
Established Patient Office Visit  Subjective:  Patient ID: Jared Preston, male    DOB: 09-23-1960  Age: 59 y.o. MRN: [id number]  CC:  Chief Complaint  Patient presents with  . Blood Pressure Check    HPI Jared Preston presents for blood pressure check he is on HCTZ 25mg  daily from a historical provider. Hypotensive today and weak. Denies shortness of breath, headaches, chest pain or lower extremity edema. He presents in a wheelchair he is total care incontinent of bowel and bladder he cannot be is with assistance .His sister provides all his care with the assistance of 3 hours a day home health Past Medical History:  Diagnosis Date  . Chronic constipation   . Hypertension   . Obesity (BMI 30.0-34.9)   . Paranoid schizophrenia (HCC)   . Stroke [facility])     No past surgical history on file.  Family History  Problem Relation Age of Onset  . Congestive Heart Failure Mother   . Diverticulitis Mother   . COPD Maternal Uncle   . Diabetes Maternal Uncle   . Cancer Maternal Uncle   . Diverticulitis Maternal Grandmother     Social History   Socioeconomic History  . Marital status: Single    Spouse name: Not on file  . Number of children: 0  . Years of education: some hs  . Highest education level: Not on file  Occupational History  . Occupation: N/A  Tobacco Use  . Smoking status: Current Every Day Smoker    Types: Cigarettes  . Smokeless tobacco: Never Used  Substance and Sexual Activity  . Alcohol use: Yes    Alcohol/week: 0.0 standard drinks  . Drug use: No  . Sexual activity: Never  Other Topics Concern  . Not on file  Social History Narrative   Drinks coffee 3 times a week    Social Determinants of Health   Financial Resource Strain:   . Difficulty of Paying Living Expenses:   Food Insecurity:   . Worried About [facility] in the Last Year:   . Programme researcher, broadcasting/film/video in the Last Year:   Transportation Needs:   . Barista (Medical):   Freight forwarder  Lack of Transportation (Non-Medical):   Physical Activity:   . Days of Exercise per Week:   . Minutes of Exercise per Session:   Stress:   . Feeling of Stress :   Social Connections:   . Frequency of Communication with Friends and Family:   . Frequency of Social Gatherings with Friends and Family:   . Attends Religious Services:   . Active Member of Clubs or Organizations:   . Attends Marland Kitchen Meetings:   Banker Marital Status:   Intimate Partner Violence:   . Fear of Current or Ex-Partner:   . Emotionally Abused:   Marland Kitchen Physically Abused:   . Sexually Abused:     Outpatient Medications Prior to Visit  Medication Sig Dispense Refill  . ARIPiprazole (ABILIFY) 9.75 MG/1.3ML injection Inject 9.75 mg into the muscle every 30 (thirty) days.    . bisacodyl (DULCOLAX) 5 MG EC tablet Take 5 mg by mouth daily as needed for moderate constipation (3 tablets at a time per his sister).    Marland Kitchen ibuprofen (ADVIL,MOTRIN) 200 MG tablet Take 200 mg by mouth 2 (two) times daily as needed for moderate pain.     . Lactase (LACTOSE FAST ACTING RELIEF PO) Take by mouth.    . linaclotide Marland Kitchen)  290 MCG CAPS capsule Take 290 mcg by mouth daily before breakfast.    . potassium chloride SA (K-DUR,KLOR-CON) 20 MEQ tablet Take 20 mEq by mouth 2 (two) times daily.   0  . sorbitol 70 % solution Take 15 mLs by mouth daily as needed. 473 mL 0  . hydrochlorothiazide (HYDRODIURIL) 50 MG tablet Take 50 mg by mouth daily.   0   No facility-administered medications prior to visit.    No Known Allergies  ROS Review of Systems  Constitutional: Positive for fatigue.  Genitourinary:       Incontinent of bowel and bladder  Musculoskeletal: Positive for gait problem.  Neurological: Positive for weakness.  All other systems reviewed and are negative.     Objective:    Physical Exam Vitals reviewed.  HENT:     Head: Normocephalic.     Nose: Nose normal.  Eyes:     Extraocular Movements: Extraocular  movements intact.  Cardiovascular:     Rate and Rhythm: Normal rate and regular rhythm.  Pulmonary:     Effort: Pulmonary effort is normal.     Breath sounds: Normal breath sounds.  Abdominal:     General: Bowel sounds are normal.  Musculoskeletal:     Cervical back: Normal range of motion.  Skin:    General: Skin is warm and dry.  Neurological:     Mental Status: He is alert and oriented to person, place, and time.     Gait: Gait abnormal.  Psychiatric:        Mood and Affect: Mood normal.     BP (!) 97/64 (BP Location: Right Arm, Patient Position: Sitting, Cuff Size: Large)   Pulse 54   Temp 98 F (36.7 C) (Oral)   Ht 6\' 2"  (1.88 m)   SpO2 100%   BMI 34.54 kg/m  Wt Readings from Last 3 Encounters:  04/20/16 269 lb (122 kg)  03/31/14 268 lb (121.6 kg)     Health Maintenance Due  Topic Date Due  . COVID-19 Vaccine (1) Never done  . COLONOSCOPY  Never done    There are no preventive care reminders to display for this patient.  No results found for: TSH Lab Results  Component Value Date   WBC 7.0 12/11/2019   HGB 13.4 12/11/2019   HCT 38.9 12/11/2019   MCV 93 12/11/2019   PLT 115 (L) 12/11/2019   Lab Results  Component Value Date   NA 140 12/11/2019   K 4.5 12/11/2019   CO2 26 12/11/2019   GLUCOSE 84 12/11/2019   BUN 8 12/11/2019   CREATININE 0.65 (L) 12/11/2019   BILITOT 0.3 12/11/2019   ALKPHOS 128 (H) 12/11/2019   AST 19 12/11/2019   ALT 17 12/11/2019   PROT 6.4 12/11/2019   ALBUMIN 4.1 12/11/2019   CALCIUM 9.1 12/11/2019   ANIONGAP 8 04/20/2016   Lab Results  Component Value Date   CHOL 114 12/11/2019   Lab Results  Component Value Date   HDL 42 12/11/2019   Lab Results  Component Value Date   LDLCALC 62 12/11/2019   Lab Results  Component Value Date   TRIG 37 12/11/2019   Lab Results  Component Value Date   CHOLHDL 2.7 12/11/2019   No results found for: HGBA1C    Assessment & Plan:  Janai was seen today for blood pressure  check.  Diagnoses and all orders for this visit:  Essential hypertension, benign Counseled on blood pressure goal of less than 130/80, low-sodium,  DASH diet, medication compliance, 150 minutes of moderate intensity exercise per week Hypotensive stopped HCTZ 25mg  daily   Hemiplegia and hemiparesis following cerebral infarction affecting left non-dominant side (HCC) Current on gabentin for pain twice daily dosage unknown sister will bring bottle in on her visit.  Need for hepatitis C screening test Health maintenance and care care  Encounter for screening for HIV Health maintenance and care care  No orders of the defined types were placed in this encounter.   Follow-up: Return if symptoms worsen or fail to improve.    , NP

## 2020-03-25 LAB — HIV ANTIBODY (ROUTINE TESTING W REFLEX): HIV Screen 4th Generation wRfx: NONREACTIVE

## 2020-03-25 LAB — HEPATITIS C ANTIBODY: Hep C Virus Ab: <0.1 s/co ratio (ref 0.0–0.9)

## 2020-03-26 ENCOUNTER — Encounter (INDEPENDENT_AMBULATORY_CARE_PROVIDER_SITE_OTHER): Payer: Self-pay | Admitting: Primary Care

## 2020-03-26 ENCOUNTER — Telehealth (INDEPENDENT_AMBULATORY_CARE_PROVIDER_SITE_OTHER): Payer: Self-pay

## 2020-03-26 NOTE — Telephone Encounter (Signed)
-----   Message from Grayce Sessions, NP sent at 03/25/2020 10:37 AM EDT ----- Hepatitis C and HIV results are negative

## 2020-03-26 NOTE — Telephone Encounter (Signed)
Patients sister is aware of negative Hepatitis C and HIV she will inform patient. Maryjean Morn, CMA

## 2020-05-19 ENCOUNTER — Telehealth (INDEPENDENT_AMBULATORY_CARE_PROVIDER_SITE_OTHER): Payer: Self-pay

## 2020-05-19 NOTE — Telephone Encounter (Signed)
Patient sister came into office stating that her brothers bed is broke and was advice by [facility] ([facility]) that they will need orders from PCP. Patient sister will also like to know how to go about getting her brother hoyer lift to help move him.   Please advice [phone] [name] - sister)     [facility] [phone]

## 2020-05-19 NOTE — Telephone Encounter (Signed)
Sent to PCP ?

## 2020-05-25 NOTE — Telephone Encounter (Signed)
Patient sister [name] is calling to check on the request for request for a hospital bed and an extender for the bed. Also requesting a Nurse, adult. Please advise CB- [phone]

## 2020-05-26 ENCOUNTER — Other Ambulatory Visit (INDEPENDENT_AMBULATORY_CARE_PROVIDER_SITE_OTHER): Payer: Self-pay | Admitting: Primary Care

## 2020-05-26 DIAGNOSIS — I69354 Hemiplegia and hemiparesis following cerebral infarction affecting left non-dominant side: Secondary | ICD-10-CM

## 2020-05-26 NOTE — Telephone Encounter (Signed)
DME completed called sister [name] no answer sent to Case manager for assistance

## 2020-07-02 ENCOUNTER — Telehealth (INDEPENDENT_AMBULATORY_CARE_PROVIDER_SITE_OTHER): Payer: Self-pay | Admitting: Primary Care

## 2020-07-02 NOTE — Telephone Encounter (Signed)
Called to check the status of Certificate of Medical Necessity forms.  Stated that when they were faxed to her, part of it was illegible.  Would like for forms to be refaxed.  Please advise and call to confirm that forms have been refaxed.  CB# [phone]

## 2020-07-02 NOTE — Telephone Encounter (Signed)
Form has been faxed back and confirmation received. Will call to confirm.

## 2020-09-29 ENCOUNTER — Other Ambulatory Visit: Payer: Self-pay

## 2020-09-29 ENCOUNTER — Ambulatory Visit (INDEPENDENT_AMBULATORY_CARE_PROVIDER_SITE_OTHER): Payer: Medicaid Other | Admitting: Primary Care

## 2020-09-29 ENCOUNTER — Encounter (INDEPENDENT_AMBULATORY_CARE_PROVIDER_SITE_OTHER): Payer: Self-pay | Admitting: Primary Care

## 2020-09-29 VITALS — BP 136/80 | HR 62 | Temp 97.9°F

## 2020-09-29 DIAGNOSIS — I10 Essential (primary) hypertension: Secondary | ICD-10-CM

## 2020-09-29 NOTE — Progress Notes (Signed)
[facility]    Mr. Bjorn Hallas. Arlana Pouch  for  hypertension evaluation, on previous visit medication was adjusted to include discontinuing  due to hypotensive. Patient is not on bp medications. Today Bp is 136/80 which is acceptable will monitor.   Current Medication List Current Outpatient Medications on File Prior to Visit  Medication Sig Dispense Refill  . ARIPiprazole (ABILIFY) 9.75 MG/1.3ML injection Inject 9.75 mg into the muscle every 30 (thirty) days.    . bisacodyl (DULCOLAX) 5 MG EC tablet Take 5 mg by mouth daily as needed for moderate constipation (3 tablets at a time per his sister).    Marland Kitchen ibuprofen (ADVIL,MOTRIN) 200 MG tablet Take 200 mg by mouth 2 (two) times daily as needed for moderate pain.     . Lactase (LACTOSE FAST ACTING RELIEF PO) Take by mouth. (Patient not taking: Reported on 09/29/2020)    . linaclotide (LINZESS) 290 MCG CAPS capsule Take 290 mcg by mouth daily before breakfast. (Patient not taking: Reported on 09/29/2020)    . potassium chloride SA (K-DUR,KLOR-CON) 20 MEQ tablet Take 20 mEq by mouth 2 (two) times daily.  (Patient not taking: Reported on 09/29/2020)  0  . sorbitol 70 % solution Take 15 mLs by mouth daily as needed. (Patient not taking: Reported on 09/29/2020) 473 mL 0   No current facility-administered medications on file prior to visit.   Past Medical History  Past Medical History:  Diagnosis Date  . Chronic constipation   . Hypertension   . Obesity (BMI 30.0-34.9)   . Paranoid schizophrenia (HCC)   . Stroke [facility])    Dietary habits include: healthy diet  Exercise habits include:unable  Family / Social history: Yes ,aunt , grand mother CHF and MI  ASCVD risk factors include- [place]  O:  Physical Exam Vitals reviewed.  Constitutional:      Appearance: He is ill-appearing.  HENT:     Head: Normocephalic.     Right Ear: External ear normal.     Left Ear: External ear normal.     Nose: Nose normal.  Eyes:     Extraocular Movements:  Extraocular movements intact.  Cardiovascular:     Rate and Rhythm: Normal rate and regular rhythm.  Pulmonary:     Effort: Pulmonary effort is normal.     Breath sounds: Normal breath sounds.  Abdominal:     General: Bowel sounds are normal. There is distension.     Palpations: Abdomen is soft.  Musculoskeletal:        General: Deformity present. Normal range of motion.     Cervical back: Normal range of motion.     Comments: CVA left side weakness  Skin:    General: Skin is dry.  Neurological:     Mental Status: He is alert and oriented to person, place, and time.     Motor: Weakness present.  Psychiatric:        Mood and Affect: Mood normal.        Behavior: Behavior normal.        Thought Content: Thought content normal.        Judgment: Judgment normal.      Review of Systems  Skin: Positive for itching.  Neurological: Positive for speech change and weakness.  All other systems reviewed and are negative.   Last 3 Office BP readings: BP Readings from Last 3 Encounters:  09/29/20 136/80  03/24/20 (!) 97/64  12/11/19 (!) 142/83    BMET    Component  Value Date/Time   NA 140 12/11/2019 1415   K 4.5 12/11/2019 1415   CL 105 12/11/2019 1415   CO2 26 12/11/2019 1415   GLUCOSE 84 12/11/2019 1415   GLUCOSE 93 04/20/2016 2131   BUN 8 12/11/2019 1415   CREATININE 0.65 (L) 12/11/2019 1415   CALCIUM 9.1 12/11/2019 1415   GFRNONAA 109 12/11/2019 1415   GFRAA 126 12/11/2019 1415    Renal function: CrCl cannot be calculated (Patient's most recent lab result is older than the maximum 21 days allowed.).  Clinical ASCVD: No The ASCVD Risk score Denman George DC Jr., et al., 2013) failed to calculate for the following reasons:   The valid total cholesterol range is 130 to 320 mg/dL   A/P: Essential hypertension, benign Hypertension longstanding not on  medications. BP Goal = 130/80 mmHg. Today 136/80 continue with life style modification. Purchase a BP monitor keep a log and  re-evaluate if BP > 140/90 -F/u labs ordered - none  -Counseled on lifestyle modifications for blood pressure control including reduced dietary sodium, increased exercise, adequate sleep  Grayce Sessions

## 2020-12-01 ENCOUNTER — Ambulatory Visit (INDEPENDENT_AMBULATORY_CARE_PROVIDER_SITE_OTHER): Payer: Medicaid Other | Admitting: Primary Care

## 2021-01-07 ENCOUNTER — Emergency Department (HOSPITAL_COMMUNITY): Payer: Medicaid Other

## 2021-01-07 ENCOUNTER — Emergency Department (HOSPITAL_COMMUNITY)
Admission: EM | Admit: 2021-01-07 | Discharge: 2021-01-11 | Disposition: A | Discharge status: Home / Self Care | Payer: Medicaid Other | Attending: Emergency Medicine | Admitting: Emergency Medicine

## 2021-01-07 DIAGNOSIS — I10 Essential (primary) hypertension: Secondary | ICD-10-CM | POA: Diagnosis not present

## 2021-01-07 DIAGNOSIS — Z20822 Contact with and (suspected) exposure to covid-19: Secondary | ICD-10-CM | POA: Diagnosis not present

## 2021-01-07 DIAGNOSIS — S7002XA Contusion of left hip, initial encounter: Secondary | ICD-10-CM | POA: Insufficient documentation

## 2021-01-07 DIAGNOSIS — R531 Weakness: Secondary | ICD-10-CM | POA: Diagnosis not present

## 2021-01-07 DIAGNOSIS — N3289 Other specified disorders of bladder: Secondary | ICD-10-CM | POA: Diagnosis not present

## 2021-01-07 DIAGNOSIS — Z23 Encounter for immunization: Secondary | ICD-10-CM | POA: Diagnosis not present

## 2021-01-07 DIAGNOSIS — F17210 Nicotine dependence, cigarettes, uncomplicated: Secondary | ICD-10-CM | POA: Diagnosis not present

## 2021-01-07 DIAGNOSIS — S79912A Unspecified injury of left hip, initial encounter: Secondary | ICD-10-CM | POA: Diagnosis present

## 2021-01-07 DIAGNOSIS — W19XXXA Unspecified fall, initial encounter: Secondary | ICD-10-CM

## 2021-01-07 DIAGNOSIS — W050XXA Fall from non-moving wheelchair, initial encounter: Secondary | ICD-10-CM | POA: Insufficient documentation

## 2021-01-07 LAB — COMPREHENSIVE METABOLIC PANEL
ALT: 11 U/L (ref 0–44)
AST: 14 U/L — ABNORMAL LOW (ref 15–41)
Albumin: 3.3 g/dL — ABNORMAL LOW (ref 3.5–5.0)
Alkaline Phosphatase: 82 U/L (ref 38–126)
Anion gap: 3 — ABNORMAL LOW (ref 5–15)
BUN: 10 mg/dL (ref 6–20)
CO2: 29 mmol/L (ref 22–32)
Calcium: 8.8 mg/dL — ABNORMAL LOW (ref 8.9–10.3)
Chloride: 107 mmol/L (ref 98–111)
Creatinine, Ser: 0.65 mg/dL (ref 0.61–1.24)
GFR, Estimated: >60 mL/min (ref >60)
Glucose, Bld: 116 mg/dL — ABNORMAL HIGH (ref 70–99)
Potassium: 3.5 mmol/L (ref 3.5–5.1)
Sodium: 139 mmol/L (ref 135–145)
Total Bilirubin: 0.4 mg/dL (ref 0.3–1.2)
Total Protein: 6 g/dL — ABNORMAL LOW (ref 6.5–8.1)

## 2021-01-07 LAB — RESP PANEL BY RT-PCR (FLU A&B, COVID) ARPGX2
Influenza A by PCR: NEGATIVE
Influenza B by PCR: NEGATIVE
SARS Coronavirus 2 by RT PCR: NEGATIVE

## 2021-01-07 LAB — CBC WITH DIFFERENTIAL/PLATELET
Abs Immature Granulocytes: 0.02 10*3/uL (ref 0.00–0.07)
Basophils Absolute: 0.1 10*3/uL (ref 0.0–0.1)
Basophils Relative: 1 %
Eosinophils Absolute: 0.2 10*3/uL (ref 0.0–0.5)
Eosinophils Relative: 3 %
HCT: 38.3 % — ABNORMAL LOW (ref 39.0–52.0)
Hemoglobin: 12.5 g/dL — ABNORMAL LOW (ref 13.0–17.0)
Immature Granulocytes: 0 %
Lymphocytes Relative: 31 %
Lymphs Abs: 2.3 10*3/uL (ref 0.7–4.0)
MCH: 31.7 pg (ref 26.0–34.0)
MCHC: 32.6 g/dL (ref 30.0–36.0)
MCV: 97.2 fL (ref 80.0–100.0)
Monocytes Absolute: 0.5 10*3/uL (ref 0.1–1.0)
Monocytes Relative: 6 %
Neutro Abs: 4.3 10*3/uL (ref 1.7–7.7)
Neutrophils Relative %: 59 %
Platelets: 188 10*3/uL (ref 150–400)
RBC: 3.94 MIL/uL — ABNORMAL LOW (ref 4.22–5.81)
RDW: 13.9 % (ref 11.5–15.5)
WBC: 7.3 10*3/uL (ref 4.0–10.5)
nRBC: 0 % (ref 0.0–0.2)

## 2021-01-07 LAB — URINALYSIS, ROUTINE W REFLEX MICROSCOPIC
Bilirubin Urine: NEGATIVE
Glucose, UA: NEGATIVE mg/dL
Hgb urine dipstick: NEGATIVE
Ketones, ur: NEGATIVE mg/dL
Nitrite: NEGATIVE
Protein, ur: NEGATIVE mg/dL
Specific Gravity, Urine: 1.013 (ref 1.005–1.030)
pH: 6 (ref 5.0–8.0)

## 2021-01-07 NOTE — ED Notes (Signed)
Pt to CT via stretcher

## 2021-01-07 NOTE — NC FL2 (Signed)
  [place] MEDICAID FL2 LEVEL OF CARE SCREENING TOOL     IDENTIFICATION  Patient Name: Jared Preston Birthdate: 04-Oct-1960 Sex: male Admission Date (Current Location): 01/07/2021  [facility] and [place] Number:  Producer, television/film/video and Address:  [facility],  [address] [place], [place] [address]      Provider Number: [id number]  Attending Physician Name and Address:  Mancel Bale, MD  Relative Name and Phone Number:  Marius Betts #[phone]    Current Level of Care: Hospital Recommended Level of Care: Skilled Nursing Facility Prior Approval Number:    Date Approved/Denied:   PASRR Number: [id number]  Discharge Plan: SNF    Current Diagnoses: There are no problems to display for this patient.   Orientation RESPIRATION BLADDER Height & Weight     Self,Time,Situation,Place  Normal Incontinent Weight: 111.1 kg Height:  6\' 1"  (185.4 cm)  BEHAVIORAL SYMPTOMS/MOOD NEUROLOGICAL BOWEL NUTRITION STATUS      Incontinent Diet  AMBULATORY STATUS COMMUNICATION OF NEEDS Skin   Extensive Assist Verbally Normal                       Personal Care Assistance Level of Assistance  Bathing,Feeding,Dressing,Total care Bathing Assistance: Maximum assistance Feeding assistance: Limited assistance Dressing Assistance: Maximum assistance Total Care Assistance: Maximum assistance   Functional Limitations Info  Sight,Speech,Hearing Sight Info: Adequate Hearing Info: Adequate Speech Info: Adequate    SPECIAL CARE FACTORS FREQUENCY  PT (By licensed PT),OT (By licensed OT)     PT Frequency: 5x per week OT Frequency: 5x per week            Contractures Contractures Info: Not present    Additional Factors Info  Code Status,Allergies Code Status Info: Full Code Allergies Info: No Allergies           Current Medications (01/07/2021):  This is the current hospital active medication list No current facility-administered medications for this encounter.    Current Outpatient Medications  Medication Sig Dispense Refill  . acetaminophen (TYLENOL) 500 MG tablet Take 500 mg by mouth every 6 (six) hours as needed for moderate pain.    . ARIPiprazole (ABILIFY) 9.75 MG/1.3ML injection Inject 9.75 mg into the muscle every 30 (thirty) days.    . bisacodyl (DULCOLAX) 5 MG EC tablet Take 15 mg by mouth daily as needed for moderate constipation.    03/09/2021 ibuprofen (ADVIL,MOTRIN) 200 MG tablet Take 200 mg by mouth 2 (two) times daily as needed for moderate pain.     . magnesium hydroxide (MILK OF MAGNESIA) 400 MG/5ML suspension Take 30 mLs by mouth daily as needed for mild constipation.    . Multiple Vitamin (MULTIVITAMIN WITH MINERALS) TABS tablet Take 1 tablet by mouth daily. Vita fusion gummies    . sorbitol 70 % solution Take 15 mLs by mouth daily as needed. (Patient not taking: No sig reported) 473 mL 0     Discharge Medications: Please see discharge summary for a list of discharge medications.  Relevant Imaging Results:  Relevant Lab Results:   Additional Information Marland Kitchen  #[id number], RN

## 2021-01-07 NOTE — ED Triage Notes (Addendum)
Pt BIB EMS from home-  Pt has home health and sister who helps provide ADL's. Sister reports pt was transferring to wheelchair when he slid out of wheelchair to floor.  Denies LOC, denies blood thinners. Pt c/o left hip and left shoulder pain from fall.    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

## 2021-01-07 NOTE — Progress Notes (Signed)
.  Transition of Care [facility]) - Emergency Department Mini Assessment   Patient Details  Name: Jared Preston MRN: [id number] Date of Birth: 05/15/1961  Transition of Care [facility]) CM/SW Contact:    Elliot Cousin, RN Phone Number: 01/07/2021, 5:31 PM   Clinical Narrative:  TOC CM spoke to pt's sister, [name]. States pt living in the home with her and she has taken care of him since 2014. States she has to have surgery. States pt is weaker and needs therapy. He was able to assist her in the home but had recent fall. Gave permission to create FL2 and fax referral for SNF placement. Pt had wheelchair, hospital bed, hoyer lift, and bedside commode. Waiting PT evaluation.   ED Mini Assessment: What brought you to the Emergency Department? : fall  Barriers to Discharge: Continued Medical Work up  [employer] interventions: waiting PT evaluation     Interventions which prevented an admission or readmission: SNF Placement    Patient Contact and Communications Key Contact 1: Hedwig Morton   Spoke with: sister Contact Date: 01/07/21,   Contact time: 1730 Contact Phone Number: [phone] Call outcome: wants placement    CMS Medicare.gov Compare Post Acute Care list provided to:: Patient Represenative (must comment) (sister-Cheryl Arlana Pouch) Choice offered to / list presented to : Sibling  Admission diagnosis:  Fall; Generalized Weakness There are no problems to display for this patient.  PCP:  Grayce Sessions, NP Pharmacy:   [facility] [phone] - [place], [place] - [address] AT [facility] [address] [place] [address] Phone: [phone] Fax: [phone]

## 2021-01-07 NOTE — ED Provider Notes (Addendum)
[facility]-EMERGENCY DEPT Provider Note   CSN: [id number] Arrival date & time: 01/07/21  1551     History Chief Complaint  Patient presents with  . Fall    Jared Preston is a 60 y.o. male.  HPI He presents for evaluation of injury to left hip when he fell, while being transferred to the floor he left into his wheelchair.  He was being assisted by his sister during this maneuver.  His sister is here and gives additional history that the patient is losing weight, and is getting weaker over the last several months.  She has trouble managing him because of disabilities of her own.  The patient has been living with his sister for about 8 years since he had a stroke.  He is confined to a wheelchair.  He has left-sided weakness.  There is been no recent fever, vomiting, cough, shortness of breath or abdominal pain.  He is taking his usual medications.  There are no other known active modifying factors   Past Medical History:  Diagnosis Date  . Chronic constipation   . Hypertension   . Obesity (BMI 30.0-34.9)   . Paranoid schizophrenia (HCC)   . Stroke [facility])     There are no problems to display for this patient.   No past surgical history on file.     Family History  Problem Relation Age of Onset  . Congestive Heart Failure Mother   . Diverticulitis Mother   . COPD Maternal Uncle   . Diabetes Maternal Uncle   . Cancer Maternal Uncle   . Diverticulitis Maternal Grandmother     Social History   Tobacco Use  . Smoking status: Current Every Day Smoker    Types: Cigarettes  . Smokeless tobacco: Never Used  Substance Use Topics  . Alcohol use: Yes    Alcohol/week: 0.0 standard drinks  . Drug use: No    Home Medications Prior to Admission medications   Medication Sig Start Date End Date Taking? Authorizing Provider  acetaminophen (TYLENOL) 500 MG tablet Take 500 mg by mouth every 6 (six) hours as needed for moderate pain.   Yes [provider]  ARIPiprazole (ABILIFY) 9.75 MG/1.3ML injection Inject 9.75 mg into the muscle every 30 (thirty) days.   Yes [provider]  bisacodyl (DULCOLAX) 5 MG EC tablet Take 15 mg by mouth daily as needed for moderate constipation.   Yes [provider]  ibuprofen (ADVIL,MOTRIN) 200 MG tablet Take 200 mg by mouth 2 (two) times daily as needed for moderate pain.    Yes [provider]  magnesium hydroxide (MILK OF MAGNESIA) 400 MG/5ML suspension Take 30 mLs by mouth daily as needed for mild constipation.   Yes [provider]  Multiple Vitamin (MULTIVITAMIN WITH MINERALS) TABS tablet Take 1 tablet by mouth daily. Vita fusion gummies   Yes [provider]  sorbitol 70 % solution Take 15 mLs by mouth daily as needed. Patient not taking: No sig reported 12/11/19   Grayce Sessions, NP    Allergies    Patient has no known allergies.  Review of Systems   Review of Systems  All other systems reviewed and are negative.   Physical Exam Updated Vital Signs BP 137/81   Pulse (!) 49   Temp 98.6 F (37 C) (Oral)   Resp 16   Ht 6\' 1"  (1.854 m)   Wt 111.1 kg   SpO2 98%   BMI 32.32 kg/m  Physical Exam Vitals and nursing note reviewed.  Constitutional:      General: He is not in acute distress.    Appearance: He is well-developed. He is not ill-appearing, toxic-appearing or diaphoretic.  HENT:     Head: Normocephalic and atraumatic.     Right Ear: External ear normal.     Left Ear: External ear normal.  Eyes:     Conjunctiva/sclera: Conjunctivae normal.     Pupils: Pupils are equal, round, and reactive to light.  Neck:     Trachea: Phonation normal.  Cardiovascular:     Rate and Rhythm: Normal rate and regular rhythm.     Heart sounds: Normal heart sounds.  Pulmonary:     Effort: Pulmonary effort is normal.     Breath sounds: Normal breath sounds.  Abdominal:     General: There is no distension.     Palpations: Abdomen is soft.      Tenderness: There is no abdominal tenderness.  Musculoskeletal:        General: No swelling or tenderness.     Cervical back: Normal range of motion and neck supple.     Comments: Weakness left arm left leg consistent with prior history of stroke.  Skin:    General: Skin is warm and dry.  Neurological:     Mental Status: He is alert and oriented to person, place, and time.     Cranial Nerves: No cranial nerve deficit.     Sensory: No sensory deficit.     Motor: No abnormal muscle tone.     Coordination: Coordination normal.     Comments: No dysarthria or aphasia.  Psychiatric:        Mood and Affect: Mood normal.        Behavior: Behavior normal.     ED Results / Procedures / Treatments   Labs (all labs ordered are listed, but only abnormal results are displayed) Labs Reviewed  COMPREHENSIVE METABOLIC PANEL - Abnormal; Notable for the following components:      Result Value   Glucose, Bld 116 (*)    Calcium 8.8 (*)    Total Protein 6.0 (*)    Albumin 3.3 (*)    AST 14 (*)    Anion gap 3 (*)    All other components within normal limits  CBC WITH DIFFERENTIAL/PLATELET - Abnormal; Notable for the following components:   RBC 3.94 (*)    Hemoglobin 12.5 (*)    HCT 38.3 (*)    All other components within normal limits  URINALYSIS, ROUTINE W REFLEX MICROSCOPIC - Abnormal; Notable for the following components:   APPearance HAZY (*)    Leukocytes,Ua LARGE (*)    Bacteria, UA RARE (*)    All other components within normal limits  RESP PANEL BY RT-PCR (FLU A&B, COVID) [id number]    EKG None  Radiology DG Chest 1 View  Result Date: 01/07/2021 CLINICAL DATA:  Pain, fall EXAM: CHEST  1 VIEW COMPARISON:  None. FINDINGS: The heart size and mediastinal contours are within normal limits. Both lungs are clear. The visualized skeletal structures are unremarkable. IMPRESSION: No active disease. Electronically Signed   By: Jasmine Pang M.D.   On: 01/07/2021 17:44   DG Pelvis 1-2  Views  Result Date: 01/07/2021 CLINICAL DATA:  Pain EXAM: PELVIS - 1-2 VIEW COMPARISON:  None. FINDINGS: Slightly limited by positioning. There is no evidence of pelvic fracture or diastasis. No pelvic bone lesions are seen. IMPRESSION: No definite acute osseous abnormality allowing  for positioning. CT follow-up if clinical suspicion for hip fracture remains high. Electronically Signed   By: Jasmine PangKim  Fujinaga M.D.   On: 01/07/2021 17:44   CT Hip Left Wo Contrast  Result Date: 01/07/2021 CLINICAL DATA:  Left hip pain following a fall tonight. EXAM: CT OF THE LEFT HIP WITHOUT CONTRAST TECHNIQUE: Multidetector CT imaging of the left hip was performed according to the standard protocol. Multiplanar CT image reconstructions were also generated. COMPARISON:  Pelvis radiograph obtained earlier today. Abdomen and pelvis CT dated 04/20/2016. FINDINGS: Bones/Joint/Cartilage No fracture or dislocation. Lower lumbar spine degenerative changes. 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 Ligaments Suboptimally assessed by CT. Muscles and Tendons Lateral left gluteal fascia a calcifications. No intramuscular hematoma seen. Soft tissues Subcutaneous edema. In the posterior urinary bladder on the left, there is a lobulated soft tissue mass measuring 2.0 x 1.5 cm on image number 57/3 and 1.9 cm in length on sagittal image number 44/9. There is also dependent calcific density in the posterior urinary bladder on the left. IMPRESSION: 1. 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 Further evaluation with cystoscopy is recommended. 2. Small dependent bladder calculi on the left. 3. No hip or pelvic fracture or dislocation. 4. Lower lumbar spine degenerative changes. Electronically Signed   By: Beckie SaltsSteven  Reid M.D.   On: 01/07/2021 21:07    Procedures Procedures   Medications Ordered in ED Medications - No data to display  ED Course   I have reviewed the triage vital signs and the nursing notes.  Pertinent labs & imaging results that were available during my care of the patient were reviewed by me and considered in my medical decision making (see chart for details).  Clinical Course as of 01/07/21 2129  Thu Jan 07, 2021  2128 CT of left hip negative for fracture.  Urinary bladder tumor present posterior aspect, unspecified, requiring further evaluation by urology with cystoscope. [EW]    Clinical Course User Index [EW] Mancel BaleWentz, Elisha Mcgruder, MD   MDM Rules/Calculators/A&P                           Patient Vitals for the past 24 hrs:  BP Temp Temp src Pulse Resp SpO2 Height Weight  01/07/21 2100 137/81 -- -- (!) 49 16 98 % -- --  01/07/21 1900 (!) 147/88 -- -- (!) 51 17 100 % -- --  01/07/21 1800 (!) 148/90 -- -- (!) 50 17 99 % -- --  01/07/21 1730 140/83 -- -- (!) 52 19 99 % -- --  01/07/21 1615 131/87 98.6 F (37 C) Oral -- -- -- 6\' 1"  (1.854 m) 111.1 kg  01/07/21 1614 -- -- -- (!) 54 15 98 % -- --  01/07/21 1608 -- -- -- -- -- 98 % -- --    8:16 PM Reevaluation with update and discussion. After initial assessment and treatment, an updated evaluation reveals no change in clinical status, he continues to complain of left hip pain. Mancel BaleElliott Aristeo Hankerson   Medical Decision Making:  This patient is presenting for evaluation of injuries from fall, which does require a range of treatment options, and is a complaint that involves a moderate risk of morbidity and mortality. The differential diagnoses include pelvic fracture, hip fracture, worsening chronic illness. I decided to review old records, and in summary debilitated male living with his sister, presenting after fall during a difficult  maneuver today..  I obtained additional historical information from sister at bedside.  Clinical Laboratory Tests Ordered, included CBC, Metabolic panel and Urinalysis. Review indicates abnormal urine with leukocytes, WBCs and rare bacteria.   Hemoglobin is slightly low.  Glucose high, calcium low, total protein low, albumin low. Radiologic Tests Ordered, included chest x-ray, pelvis found.  I independently Visualized: Radiograph images, which show no distinct fracture.  We will get CT hip to evaluate further    Critical Interventions-clinical evaluation, laboratory testing, radiography.  Requested PCP consultation for determination of needs. TOC consultation-proceeding with possible skilled nurse facility placement  After These Interventions, the Patient was reevaluated and was found with pain, following fall.  Patient gradually debilitated over several months, which culminated in the fall today.  Patient being assessed for placement.  No overt injury or illness.  Screening viral panel negative.  Repeat imaging pelvis, for hip fracture is negative.  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  This can be done semiurgently, as an outpatient following current stabilization.  CRITICAL CARE-no Performed by: Mancel Bale  Nursing Notes Reviewed/ Care Coordinated Applicable Imaging Reviewed Interpretation of Laboratory Data incorporated into ED treatment   Plan-PT consultation, likely placement.  Patient not amenable to modification of advance in-home services which he is currently receiving.  Therefore he will likely need to be placed in a higher level of care.  Final Clinical Impression(s) / ED Diagnoses Final diagnoses:  Fall, initial encounter  Weakness  Contusion of left hip, initial encounter  Mass of urinary bladder    Rx / DC Orders ED Discharge Orders    None           Mancel Bale, MD 01/07/21 2143

## 2021-01-08 MED ORDER — MAGNESIUM HYDROXIDE 400 MG/5ML PO SUSP
30.0000 mL | Freq: Every day | ORAL | Status: DC | PRN
  Filled 2021-01-08: qty 30

## 2021-01-08 MED ORDER — COVID-19 MRNA VACC (MODERNA) 50 MCG/0.25ML IM SUSP
0.2500 mL | Freq: Once | INTRAMUSCULAR | Status: AC
  Administered 2021-01-08: 0.25 mL via INTRAMUSCULAR
  Filled 2021-01-08: qty 0.25

## 2021-01-08 MED ORDER — ACETAMINOPHEN 500 MG PO TABS
500.0000 mg | ORAL_TABLET | Freq: Four times a day (QID) | ORAL | Status: DC | PRN
  Administered 2021-01-08 – 2021-01-10 (×4): 500 mg via ORAL
  Filled 2021-01-08 (×4): qty 1

## 2021-01-08 MED ORDER — BISACODYL 5 MG PO TBEC: 15.0000 mg | DELAYED_RELEASE_TABLET | Freq: Every day | ORAL | Status: DC | PRN

## 2021-01-08 MED ORDER — ARIPIPRAZOLE 9.75 MG/1.3ML IM SOLN: 9.7500 mg | INTRAMUSCULAR | Status: DC

## 2021-01-08 MED ORDER — ACETAMINOPHEN 500 MG PO TABS
500.0000 mg | ORAL_TABLET | Freq: Once | ORAL | Status: AC
  Administered 2021-01-08: 500 mg via ORAL
  Filled 2021-01-08: qty 1

## 2021-01-08 MED ORDER — ACETAMINOPHEN 325 MG PO TABS
650.0000 mg | ORAL_TABLET | Freq: Once | ORAL | Status: AC
  Administered 2021-01-08: 650 mg via ORAL
  Filled 2021-01-08: qty 2

## 2021-01-08 MED ORDER — ARIPIPRAZOLE ER 300 MG IM PRSY
300.0000 mg | PREFILLED_SYRINGE | Freq: Once | INTRAMUSCULAR | Status: AC
  Administered 2021-01-09: 300 mg via INTRAMUSCULAR
  Filled 2021-01-08: qty 300

## 2021-01-08 NOTE — Progress Notes (Addendum)
01/08/2021 5:04 pm TOC CM offered choice for SNF, [facility] and [facility]. Sister wants [facility]. Lacinda Axon, spoke to [facility] and they have accepted referral and are working on possible dc on tomorrow or Monday. Waiting on clearance from business office for his Medicaid. Pt had Moderna vaccine and was schedule for booster on last month per sister, [name]. Ed provider updated. Pt will receive booster. At [employer] will fax COVID card, [facility] immunization card, COVID results and AVS to facility. Lacinda Axon will call when insurance approval has been complete. Isidoro Donning RN CCM, [facility] ED TOC [facility] [phone]  01/08/2021 2:15 pm TOC CM contacted [facility] with acceptance for SNF placement for rehab. Waiting call back. Isidoro Donning RN CCM, [facility] ED TOC CM [phone]

## 2021-01-08 NOTE — ED Notes (Signed)
Family to bedside at this time.

## 2021-01-08 NOTE — ED Notes (Signed)
Patient provided with dinner tray at this time. 

## 2021-01-08 NOTE — ED Notes (Signed)
Patient provided with breakfast tray at this time.

## 2021-01-08 NOTE — Progress Notes (Addendum)
Immunization Summary  [name]  MRN: [id number]   Patient Information   Patient Information  Patient Name  Jared, Preston Legal Sex  Male DOB  [birth year]   Immunizations by Immunization Family  Moderna SARS-COV2 Booster Vaccination 01/08/2021 (60 y.o.)     Tdap 03/24/2020 (60 y.o.)

## 2021-01-08 NOTE — Progress Notes (Signed)
TOC CSW faxed pts information to facilities for bed offer.   HUB-[facility]  [facility]  [facility]  [facility]  [facility]  [facility]  [facility]  [facility] [place]  [facility]  [facility]  [facility]  [facility]  [facility]  [facility]  [facility]  [facility] [place]  [facility]  [facility]  [facility]   Sanvi Ehler Tarpley-Carter, MSW, LCSW-A Pronouns:  She, Her, Hers                  [facility] ED Transitions of CareClinical Social Worker [email or web address] [phone]

## 2021-01-08 NOTE — Evaluation (Signed)
Physical Therapy Treatment Patient Details Name: Jared Preston MRN: [id number] DOB: Sep 04, 1960 Today's Date: 01/08/2021    History of Present Illness Pt is 60 yo male admitted on 01/07/21 who fell while transferring to w/c with L hip injury - all imaging negative for fx.  Did find incidental bladder tumor. Pt with hx of HTN and multiple CVAs.    PT Comments    Pt admitted with above diagnosis. Pt has had multiple CVAs in past and has been w/c bound for ~8 years.  He has normally been able to assist with lateral scoots or squat pivots to a chair.  However, has been weakening over the past year, sister injured herself and unable to assist so further weakening past 3 months since not getting up as much, and now with fall and further decline in mobility.  Today, pt requiring mod A of 2 for bed mobility and max A for lateral scoot at EOB.  He has weakness in all extremities.  Pt with low PLOF but is below his baseline.  Sister reports unable to take care of pt properly at home anymore due to her own injury. Pt would benefit from SNF to return to baseline then transition to long term. Pt currently with functional limitations due to the deficits listed below (see PT Problem List). Pt will benefit from skilled PT to increase their independence and safety with mobility to allow discharge to the venue listed below.      Follow Up Recommendations  SNF (with transition to long term likely)     Equipment Recommendations  None recommended by PT (has DME)    Recommendations for Other Services       Precautions / Restrictions Precautions Precautions: Fall    Mobility  Bed Mobility Overal bed mobility: Needs Assistance Bed Mobility: Supine to Sit;Sit to Supine;Rolling Rolling: Max assist   Supine to sit: Mod assist;+2 for physical assistance Sit to supine: Mod assist;+2 for physical assistance   General bed mobility comments: Requiring cues and increased time, assist with positioning of LE to  facilitate transfers, required assist for legs and trunk with all transfers    Transfers Overall transfer level: Needs assistance   Transfers: Lateral/Scoot Transfers          Lateral/Scoot Transfers: Max assist;+2 physical assistance General transfer comment: Pt unable to stand baseline.  Seen in ED so no chair to transfer into.  Did laterally scoot at EOB with max A and cues to push with R leg and arms.  Ambulation/Gait             General Gait Details: non ambulatory   Stairs             Wheelchair Mobility    Modified Rankin (Stroke Patients Only)       Balance Overall balance assessment: Needs assistance Sitting-balance support: Bilateral upper extremity supported;Feet supported Sitting balance-Leahy Scale: Poor Sitting balance - Comments: Requiring UE support and min A 95% of the time.  Was able to briefly sit without support from therapist     Standing balance-Leahy Scale: Zero                              Cognition Arousal/Alertness: Awake/alert Behavior During Therapy: WFL for tasks assessed/performed Overall Cognitive Status: History of cognitive impairments - at baseline  Exercises      General Comments General comments (skin integrity, edema, etc.): Sister present and provided hx      Pertinent Vitals/Pain Pain Assessment: Faces Faces Pain Scale: Hurts little more Pain Location: L leg sore from fall Pain Descriptors / Indicators: Discomfort Pain Intervention(s): Limited activity within patient's tolerance;Monitored during session    Home Living Family/patient expects to be discharged to:: Skilled nursing facility Living Arrangements: Other (Comment) (sister) Available Help at Discharge: Family;Personal care attendant;Available 24 hours/day (has aide 3 hr per day) Type of Home: House       Home Equipment: Wheelchair - manual;Hospital bed;Bedside  commode Additional Comments: hoyer lift ; drop arm on w/c and BSC    Prior Function Level of Independence: Needs assistance  Gait / Transfers Assistance Needed: Pt is non ambulatory.  He has had multiple CVAs with bil weakness.  Pt was transferring from bed to chair with min A and scooting or pivots but has became weaker in the past year and particulary the last 3 months.  Used hoyer at time.  Sister assist with transfers but has hurt her shoulder so pt has been getting up less since she has been unable to assist. ADL's / Homemaking Assistance Needed: Has assist with ADLs     PT Goals (current goals can now be found in the care plan section) Acute Rehab PT Goals Patient Stated Goal: sister reports - short term rehab to regain what he can then long term placement PT Goal Formulation: With patient/family Time For Goal Achievement: 01/22/21 Potential to Achieve Goals: Poor    Frequency    Min 2X/week      PT Plan      Co-evaluation              AM-PAC PT "6 Clicks" Mobility   Outcome Measure  Help needed turning from your back to your side while in a flat bed without using bedrails?: A Lot Help needed moving from lying on your back to sitting on the side of a flat bed without using bedrails?: Total Help needed moving to and from a bed to a chair (including a wheelchair)?: Total Help needed standing up from a chair using your arms (e.g., wheelchair or bedside chair)?: Total Help needed to walk in hospital room?: Total Help needed climbing 3-5 steps with a railing? : Total 6 Click Score: 7    End of Session   Activity Tolerance: Patient tolerated treatment well;Other (comment) (limited due to seen in ED on stretcher) Patient left: in bed;with call bell/phone within reach;with family/visitor present Nurse Communication: Mobility status PT Visit Diagnosis: Muscle weakness (generalized) (M62.81);Unsteadiness on feet (R26.81)     Time: 3220-2542 PT Time Calculation (min)  (ACUTE ONLY): 25 min  Charges:  $Therapeutic Activity: 8-22 mins                     Jared Preston, PT Acute Rehab Services Pager [phone] [facility] [phone]     Jared Preston 01/08/2021, 12:33 PM

## 2021-01-08 NOTE — ED Notes (Signed)
Patient provided with lemon lime soda, lunch tray at this time

## 2021-01-08 NOTE — ED Notes (Signed)
PT to bedside at this time 

## 2021-01-08 NOTE — ED Notes (Signed)
Patient provided with coffee per request.

## 2021-01-08 NOTE — ED Notes (Signed)
Patient provided with coffee and graham crackers per request, scooted up in bed, head of bed raised at this time

## 2021-01-09 MED ORDER — CALCIUM CARBONATE ANTACID 500 MG PO CHEW
1.0000 | CHEWABLE_TABLET | Freq: Once | ORAL | Status: AC
  Administered 2021-01-09: 200 mg via ORAL
  Filled 2021-01-09: qty 1

## 2021-01-09 NOTE — Progress Notes (Addendum)
CSW spoke with Caro Laroche, Merchandiser, retail at [facility].  He is not aware of an admission today but will speak with Malena Peer and call back with update. Daleen Squibb, MSW, LCSW 5/14/20229:34 AM   TC from [place], admissions at [facility].  No medicaid auth yet, will not be able to admit until Monday. Daleen Squibb, MSW, LCSW 5/14/202212:48 PM

## 2021-01-09 NOTE — ED Notes (Signed)
Patient was given coffee to drink

## 2021-01-10 NOTE — Progress Notes (Signed)
TOC CSW pt will be dc'd to [facility] on Monday (01/11/2021) when Medicaid can be verified.  Claudette Wermuth Tarpley-Carter, MSW, LCSW-A Pronouns:  She, Her, Hers                  [facility] ED Transitions of CareClinical Social Worker [email or web address] [phone]

## 2021-01-11 NOTE — Progress Notes (Signed)
TOC CSW received a return call from [facility] [phone].  Information for admission was given.  CSW will share information with Waynetta Sandy, RN to assist with dc.  Room#:  [address]  Call Report #:  [phone]  Lendy Dittrich Tarpley-Carter, MSW, LCSW-A Pronouns:  She, Her, Hers                  [facility] Transitions of CareClinical Social Worker [email or web address] [phone]

## 2021-01-11 NOTE — ED Notes (Signed)
Called number given for report, there was no answer.

## 2021-01-11 NOTE — Discharge Instructions (Signed)
Take Tylenol for pain for your hip.  Follow-up with your family doctor or your orthopedic doctor for your hip.  Follow-up with [facility] for your bladder in the next week or 2

## 2021-01-11 NOTE — ED Notes (Signed)
Pt DCd off unit to home per provider. Pt alert, calm, cooperative, no s/s of distress.  DC information and belongings given to [facility] staff. Pt off unit on stretcher, escorted and transported by [facility]

## 2021-01-11 NOTE — ED Notes (Addendum)
Patient's purewick was disconnected for unknown length of time. Bed saturated in urine.   Purewick replaced.  Patient bathed and linen changed.

## 2023-10-11 ENCOUNTER — Encounter (HOSPITAL_COMMUNITY): Payer: Self-pay

## 2023-10-11 ENCOUNTER — Emergency Department (HOSPITAL_COMMUNITY): Payer: Medicaid Other

## 2023-10-11 ENCOUNTER — Other Ambulatory Visit: Payer: Self-pay

## 2023-10-11 ENCOUNTER — Inpatient Hospital Stay (HOSPITAL_COMMUNITY)
Admission: EM | Admit: 2023-10-11 | Discharge: 2023-10-14 | DRG: 392 | Disposition: A | Discharge status: Skilled Nursing Facility | Payer: Medicaid Other | Source: Skilled Nursing Facility | Attending: Family Medicine | Admitting: Family Medicine

## 2023-10-11 DIAGNOSIS — F17210 Nicotine dependence, cigarettes, uncomplicated: Secondary | ICD-10-CM | POA: Diagnosis present

## 2023-10-11 DIAGNOSIS — F259 Schizoaffective disorder, unspecified: Secondary | ICD-10-CM | POA: Diagnosis present

## 2023-10-11 DIAGNOSIS — Z825 Family history of asthma and other chronic lower respiratory diseases: Secondary | ICD-10-CM

## 2023-10-11 DIAGNOSIS — E860 Dehydration: Secondary | ICD-10-CM | POA: Diagnosis present

## 2023-10-11 DIAGNOSIS — A084 Viral intestinal infection, unspecified: Secondary | ICD-10-CM | POA: Diagnosis present

## 2023-10-11 DIAGNOSIS — Z8249 Family history of ischemic heart disease and other diseases of the circulatory system: Secondary | ICD-10-CM | POA: Diagnosis not present

## 2023-10-11 DIAGNOSIS — K567 Ileus, unspecified: Secondary | ICD-10-CM | POA: Diagnosis present

## 2023-10-11 DIAGNOSIS — I119 Hypertensive heart disease without heart failure: Secondary | ICD-10-CM | POA: Diagnosis present

## 2023-10-11 DIAGNOSIS — R944 Abnormal results of kidney function studies: Secondary | ICD-10-CM | POA: Diagnosis present

## 2023-10-11 DIAGNOSIS — Z6837 Body mass index (BMI) 37.0-37.9, adult: Secondary | ICD-10-CM | POA: Diagnosis not present

## 2023-10-11 DIAGNOSIS — Z7401 Bed confinement status: Secondary | ICD-10-CM

## 2023-10-11 DIAGNOSIS — Z833 Family history of diabetes mellitus: Secondary | ICD-10-CM | POA: Diagnosis not present

## 2023-10-11 DIAGNOSIS — Z8673 Personal history of transient ischemic attack (TIA), and cerebral infarction without residual deficits: Secondary | ICD-10-CM

## 2023-10-11 DIAGNOSIS — R739 Hyperglycemia, unspecified: Secondary | ICD-10-CM

## 2023-10-11 DIAGNOSIS — R1110 Vomiting, unspecified: Secondary | ICD-10-CM | POA: Diagnosis present

## 2023-10-11 DIAGNOSIS — Z7982 Long term (current) use of aspirin: Secondary | ICD-10-CM | POA: Diagnosis not present

## 2023-10-11 DIAGNOSIS — R748 Abnormal levels of other serum enzymes: Secondary | ICD-10-CM | POA: Diagnosis present

## 2023-10-11 DIAGNOSIS — Z993 Dependence on wheelchair: Secondary | ICD-10-CM | POA: Diagnosis not present

## 2023-10-11 DIAGNOSIS — Z79899 Other long term (current) drug therapy: Secondary | ICD-10-CM | POA: Diagnosis not present

## 2023-10-11 DIAGNOSIS — R799 Abnormal finding of blood chemistry, unspecified: Secondary | ICD-10-CM

## 2023-10-11 LAB — CBC WITH DIFFERENTIAL/PLATELET
Abs Immature Granulocytes: 0.01 10*3/uL (ref 0.00–0.07)
Basophils Absolute: 0 10*3/uL (ref 0.0–0.1)
Basophils Relative: 0 %
Eosinophils Absolute: 0 10*3/uL (ref 0.0–0.5)
Eosinophils Relative: 0 %
HCT: 50.2 % (ref 39.0–52.0)
Hemoglobin: 16.5 g/dL (ref 13.0–17.0)
Immature Granulocytes: 0 %
Lymphocytes Relative: 16 %
Lymphs Abs: 1 10*3/uL (ref 0.7–4.0)
MCH: 30 pg (ref 26.0–34.0)
MCHC: 32.9 g/dL (ref 30.0–36.0)
MCV: 91.3 fL (ref 80.0–100.0)
Monocytes Absolute: 0.6 10*3/uL (ref 0.1–1.0)
Monocytes Relative: 10 %
Neutro Abs: 4.6 10*3/uL (ref 1.7–7.7)
Neutrophils Relative %: 74 %
Platelets: 296 10*3/uL (ref 150–400)
RBC: 5.5 MIL/uL (ref 4.22–5.81)
RDW: 14 % (ref 11.5–15.5)
WBC: 6.3 10*3/uL (ref 4.0–10.5)
nRBC: 0 % (ref 0.0–0.2)

## 2023-10-11 LAB — HIV ANTIBODY (ROUTINE TESTING W REFLEX): HIV Screen 4th Generation wRfx: NONREACTIVE

## 2023-10-11 LAB — COMPREHENSIVE METABOLIC PANEL
ALT: 13 U/L (ref 0–44)
AST: 22 U/L (ref 15–41)
Albumin: 3.5 g/dL (ref 3.5–5.0)
Alkaline Phosphatase: 166 U/L — ABNORMAL HIGH (ref 38–126)
Anion gap: 13 (ref 5–15)
BUN: 26 mg/dL — ABNORMAL HIGH (ref 6–20)
CO2: 21 mmol/L — ABNORMAL LOW (ref 22–32)
Calcium: 9 mg/dL (ref 8.9–10.3)
Chloride: 103 mmol/L (ref 98–111)
Creatinine, Ser: 0.78 mg/dL (ref 0.61–1.24)
GFR, Estimated: >60 mL/min (ref >60)
Glucose, Bld: 146 mg/dL — ABNORMAL HIGH (ref 70–99)
Potassium: 3.8 mmol/L (ref 3.5–5.1)
Sodium: 137 mmol/L (ref 135–145)
Total Bilirubin: 0.7 mg/dL (ref 0.0–1.2)
Total Protein: 8.1 g/dL (ref 6.5–8.1)

## 2023-10-11 LAB — LIPASE, BLOOD: Lipase: 18 U/L (ref 11–51)

## 2023-10-11 LAB — TROPONIN I (HIGH SENSITIVITY)
Troponin I (High Sensitivity): 4 ng/L (ref <18)
Troponin I (High Sensitivity): 5 ng/L (ref <18)

## 2023-10-11 MED ORDER — ONDANSETRON HCL 4 MG/2ML IJ SOLN: 4.0000 mg | Freq: Four times a day (QID) | INTRAMUSCULAR | Status: DC | PRN

## 2023-10-11 MED ORDER — POLYETHYLENE GLYCOL 3350 17 G PO PACK
17.0000 g | PACK | Freq: Every day | ORAL | Status: DC
  Administered 2023-10-11 – 2023-10-14 (×4): 17 g via ORAL
  Filled 2023-10-11 (×4): qty 1

## 2023-10-11 MED ORDER — MORPHINE SULFATE (PF) 4 MG/ML IV SOLN
4.0000 mg | Freq: Once | INTRAVENOUS | Status: AC
  Administered 2023-10-11: 4 mg via INTRAVENOUS
  Filled 2023-10-11: qty 1

## 2023-10-11 MED ORDER — ACETAMINOPHEN 500 MG PO TABS
1000.0000 mg | ORAL_TABLET | Freq: Two times a day (BID) | ORAL | Status: DC
  Administered 2023-10-11 – 2023-10-14 (×7): 1000 mg via ORAL
  Filled 2023-10-11 (×7): qty 2

## 2023-10-11 MED ORDER — POTASSIUM CHLORIDE 2 MEQ/ML IV SOLN
INTRAVENOUS | Status: AC
  Filled 2023-10-11: qty 1000

## 2023-10-11 MED ORDER — KCL IN DEXTROSE-NACL 20-5-0.9 MEQ/L-%-% IV SOLN
INTRAVENOUS | Status: DC
  Filled 2023-10-11: qty 1000

## 2023-10-11 MED ORDER — SENNOSIDES-DOCUSATE SODIUM 8.6-50 MG PO TABS
2.0000 | ORAL_TABLET | Freq: Two times a day (BID) | ORAL | Status: DC
  Administered 2023-10-11 – 2023-10-14 (×5): 2 via ORAL
  Filled 2023-10-11 (×6): qty 2

## 2023-10-11 MED ORDER — ONDANSETRON HCL 4 MG/2ML IJ SOLN
4.0000 mg | Freq: Once | INTRAMUSCULAR | Status: AC
  Administered 2023-10-11: 4 mg via INTRAVENOUS
  Filled 2023-10-11: qty 2

## 2023-10-11 MED ORDER — ASPIRIN 81 MG PO TBEC
81.0000 mg | DELAYED_RELEASE_TABLET | Freq: Every day | ORAL | Status: DC
  Administered 2023-10-11 – 2023-10-14 (×4): 81 mg via ORAL
  Filled 2023-10-11 (×4): qty 1

## 2023-10-11 MED ORDER — ENOXAPARIN SODIUM 40 MG/0.4ML IJ SOSY
40.0000 mg | PREFILLED_SYRINGE | INTRAMUSCULAR | Status: DC
  Administered 2023-10-11 – 2023-10-14 (×4): 40 mg via SUBCUTANEOUS
  Filled 2023-10-11 (×4): qty 0.4

## 2023-10-11 MED ORDER — IOHEXOL 350 MG/ML SOLN
75.0000 mL | Freq: Once | INTRAVENOUS | Status: AC | PRN
Start: 2023-10-11 — End: 2023-10-11
  Administered 2023-10-11: 75 mL via INTRAVENOUS

## 2023-10-11 MED ORDER — ONDANSETRON HCL 4 MG PO TABS: 4.0000 mg | ORAL_TABLET | Freq: Four times a day (QID) | ORAL | Status: DC | PRN

## 2023-10-11 NOTE — ED Provider Notes (Signed)
EMERGENCY DEPARTMENT AT [facility] Provider Note   CSN: [id number] Arrival date & time: 10/11/23  0054     History  Chief Complaint  Patient presents with   Emesis    Jared Preston is a 63 y.o. male.  The history is provided by the nursing home.  Emesis He has history of hypertension, stroke, paranoid schizophrenia, chronic constipation and was sent from skilled nursing facility because x-ray showed bowel obstruction.  He has had vomiting.  Patient is a poor historian, is complaining of some pain in his chest but denies abdominal pain.   Home Medications Prior to Admission medications   Medication Sig Start Date End Date Taking? Authorizing Provider  acetaminophen (TYLENOL) 500 MG tablet Take 500 mg by mouth every 6 (six) hours as needed for moderate pain.    [provider]  ARIPiprazole ER (ABILIFY MAINTENA) 300 MG PRSY prefilled syringe Inject 300 mg into the muscle every 28 (twenty-eight) days.    [provider]  bisacodyl (DULCOLAX) 5 MG EC tablet Take 15 mg by mouth daily as needed for moderate constipation.    [provider]  ibuprofen (ADVIL,MOTRIN) 200 MG tablet Take 200 mg by mouth 2 (two) times daily as needed for moderate pain.     [provider]  magnesium hydroxide (MILK OF MAGNESIA) 400 MG/5ML suspension Take 30 mLs by mouth daily as needed for mild constipation.    [provider]  Multiple Vitamin (MULTIVITAMIN WITH MINERALS) TABS tablet Take 1 tablet by mouth daily. Vita fusion gummies    [provider]  sorbitol 70 % solution Take 15 mLs by mouth daily as needed. Patient not taking: No sig reported 12/11/19   Grayce Sessions, NP      Allergies    Patient has no known allergies.    Review of Systems   Review of Systems  Gastrointestinal:  Positive for vomiting.  All other systems reviewed and are negative.   Physical Exam Updated Vital Signs BP (!) 142/81   Pulse 62    Temp 98 F (36.7 C)   Resp 13   Ht 6\' 1"  (1.854 m)   Wt 130.2 kg   SpO2 94%   BMI 37.87 kg/m  Physical Exam Vitals and nursing note reviewed.   63 year old male, resting comfortably and in no acute distress. Vital signs are significant for borderline elevated blood pressure. Oxygen saturation is 94%, which is normal. Head is normocephalic and atraumatic. PERRLA, EOMI.  Lungs are clear without rales, wheezes, or rhonchi. Chest is nontender. Heart has regular rate and rhythm without murmur. Abdomen is soft, slightly distended, nontender. Extremities have trace edema. Skin is warm and dry without rash. Neurologic: Awake, moves all extremities equally.  ED Results / Procedures / Treatments   Labs (all labs ordered are listed, but only abnormal results are displayed) Labs Reviewed  COMPREHENSIVE METABOLIC PANEL - Abnormal; Notable for the following components:      Result Value   CO2 21 (*)    Glucose, Bld 146 (*)    BUN 26 (*)    Alkaline Phosphatase 166 (*)    All other components within normal limits  LIPASE, BLOOD  CBC WITH DIFFERENTIAL/PLATELET  TROPONIN I (HIGH SENSITIVITY)  TROPONIN I (HIGH SENSITIVITY)    EKG EKG Interpretation Date/Time:  Wednesday October 11 2023 01:09:12 EST Ventricular Rate:  71 PR Interval:  141 QRS Duration:  95 QT Interval:  405 QTC Calculation: 441 R Axis:   -  2  Text Interpretation: Sinus rhythm Abnormal R-wave progression, early transition Left ventricular hypertrophy Inferior infarct, old No significant change was found Confirmed by Dione Booze ([id number]) on 10/11/2023 1:38:14 AM  Radiology CT ABDOMEN PELVIS W CONTRAST Result Date: 10/11/2023 CLINICAL DATA:  Bowel obstruction suspected. EXAM: CT ABDOMEN AND PELVIS WITH CONTRAST TECHNIQUE: Multidetector CT imaging of the abdomen and pelvis was performed using the standard protocol following bolus administration of intravenous contrast. RADIATION DOSE REDUCTION: This exam was performed  according to the departmental dose-optimization program which includes automated exposure control, adjustment of the mA and/or kV according to patient size and/or use of iterative reconstruction technique. CONTRAST:  75mL OMNIPAQUE IOHEXOL 350 MG/ML SOLN COMPARISON:  04/20/2016 FINDINGS: Lower chest: Small right pleural effusion. Hepatobiliary: No acute abnormality. Pancreas: Unremarkable. Spleen: Unremarkable. Adrenals/Urinary Tract: Stable adrenal glands. No urinary calculi or hydronephrosis. Asymmetric right bladder wall thickening and mucosal hyperenhancement. Stomach/Bowel: Enteric tube tip in the stomach. Stomach is within normal limits. Diffuse small bowel dilation with air-fluid levels. No single discrete transition point. There are multiple areas of small-bowel narrowing with associated mild wall thickening. For example in the central abdomen on series 6/image 88, in the left hemiabdomen on series 6/image 73, and in the left hemiabdomen on series 3/image 53. The distal ileum is decompressed. Normal caliber colon containing stool. Normal appendix. Vascular/Lymphatic: No significant vascular findings are present. No enlarged abdominal or pelvic lymph nodes. Reproductive: No acute abnormality. Other: No free intraperitoneal fluid or air. Musculoskeletal: No acute fracture. IMPRESSION: 1. Diffuse small bowel dilation with air-fluid levels. No single discrete transition point. There are multiple areas of small-bowel narrowing with associated mild wall thickening. Findings are favored to represent ileus secondary to enteritis. 2. Asymmetric right bladder wall thickening and mucosal hyperenhancement. Correlate with cystoscopy to exclude malignancy. 3. Small right pleural effusion. Electronically Signed   By: Minerva Fester M.D.   On: 10/11/2023 02:54    Procedures NG placement  Date/Time: 10/11/2023 4:28 AM  Performed by: Dione Booze, MD Authorized by: Dione Booze, MD  Consent: The procedure was  performed in an emergent situation. Required items: required blood products, implants, devices, and special equipment available Patient identity confirmed: hospital-assigned identification number and arm band Time out: Immediately prior to procedure a "time out" was called to verify the correct patient, procedure, equipment, support staff and site/side marked as required. Local anesthesia used: no  Anesthesia: Local anesthesia used: no  Sedation: Patient sedated: no  Patient tolerance: patient tolerated the procedure well with no immediate complications       Medications Ordered in ED Medications - No data to display  ED Course/ Medical Decision Making/ A&P                                 Medical Decision Making Amount and/or Complexity of Data Reviewed Labs: ordered. Radiology: ordered.  Risk Prescription drug management. Decision regarding hospitalization.   Vomiting with x-ray report of small bowel obstruction.  X-ray report accompanying the patient.  Unfortunately, I cannot independently viewed the images.  I have ordered a nasogastric tube and I have ordered laboratory workup and CT of abdomen and pelvis.  Because of patient's report of chest pain, I have also ordered an ECG and troponin.  I reviewed his electrocardiogram, my interpretation is left ventricular hypertrophy but no ST or T changes.  I have reviewed his laboratory tests, and my interpretation is mildly elevated BUN with normal  creatinine consistent with dehydration, elevated alkaline phosphatase of uncertain significance, elevated random glucose, normal lipase, normal CBC, normal troponin.  CT of chest, abdomen, pelvis shows evidence of ileus without evidence of obstruction.  Following NG insertion, large amount of stomach contents was obtained, I do not feel he is safe for discharge.  I have discussed case with Dr. Margo Aye of [facility], who agrees to admit the patient.  Final Clinical Impression(s) / ED  Diagnoses Final diagnoses:  Ileus (HCC)  Elevated alkaline phosphatase level  Elevated random blood glucose level  Elevated BUN    Rx / DC Orders ED Discharge Orders     None         Dione Booze, MD 10/11/23 [phone]

## 2023-10-11 NOTE — Plan of Care (Signed)

## 2023-10-11 NOTE — H&P (Addendum)
History and Physical    Patient: Jared Preston:[id number] DOB: Jan 23, 1961 DOA: 10/11/2023 DOS: the patient was seen and examined on 10/11/2023 PCP: Jared Preston  Patient coming from: SNF  Chief Complaint:  Chief Complaint  Patient presents with   Emesis   HPI: Jared Preston is a 63 y.o. male with medical history significant of schizoaffective disorder, CVA, chronic constipation, hypertension, obesity, wheelchair-bound.  Patient was sent to the ER from [place] skilled nursing facility with reports of abdominal pain and an x-ray at the facility that apparently was consistent with a small bowel obstruction.  Upon arrival to the ED patient was covered in emesis.  In the ED the patient was afebrile and otherwise hemodynamically stable.  When asked he reported to the ER physician that he had some pain in his chest but was documented as a poor historian.  CT abdomen and pelvis here demonstrated ileus in the setting of small bowel enteritis.  There was also an incidental finding of irregular right bladder wall thickening that could be secondary to malignancy.  Labs were unremarkable.  CO2 was 21, glucose 146, BUN 26 with a creatinine of 0.78.  Troponin was normal, white count was normal.  Hemoglobin 16.5 is higher than patient's baseline from 2022 of 12.5.  This finding is concerning for volume depletion.  An NG tube was placed in the ED and 2300 cc of bilious fluid was obtained.  Upon my evaluation of the patient he had been moved to a room out of the ED.  He had pulled out his NG tube.  His abdomen was soft and nontender to palpation.  Again patient is a poor historian and history has been obtained from the medical record.  Review of Systems: As mentioned in the history of present illness. All other systems reviewed and are negative. Past Medical History:  Diagnosis Date   Chronic constipation    Hypertension    Obesity (BMI 30.0-34.9)    Paranoid schizophrenia (HCC)    Stroke [facility])     History reviewed. No pertinent surgical history. Social History:  reports that he has been smoking cigarettes. He has never used smokeless tobacco. He reports current alcohol use. He reports that he does not use drugs.  No Known Allergies  Family History  Problem Relation Age of Onset   Congestive Heart Failure Mother    Diverticulitis Mother    COPD Maternal Uncle    Diabetes Maternal Uncle    Cancer Maternal Uncle    Diverticulitis Maternal Grandmother     Prior to Admission medications   Medication Sig Start Date End Date Taking? Authorizing Provider  acetaminophen (TYLENOL) 500 MG tablet Take 1,000 mg by mouth 2 (two) times daily. Do not exceed 4 grams   Yes [provider]  acetaminophen (TYLENOL) 500 MG tablet Take 500 mg by mouth every 6 (six) hours as needed for mild pain (pain score 1-3). Do not exceed 4 grams in 24hrs.   Yes [provider]  ARIPiprazole ER (ABILIFY MAINTENA) 300 MG PRSY prefilled syringe Inject 300 mg into the muscle every 28 (twenty-eight) days.   Yes [provider]  aspirin EC 81 MG tablet Take 81 mg by mouth daily. Swallow whole.   Yes [provider]  Baclofen 5 MG TABS Take 5 mg by mouth at bedtime. Shoulder pain   Yes [provider]  cyanocobalamin (VITAMIN B12) 1000 MCG tablet Take 1,000 mcg by mouth once a week. Give on Tuesday  Yes [provider]  gabapentin (NEURONTIN) 400 MG capsule Take 400 mg by mouth 2 (two) times daily.   Yes [provider]  hydrALAZINE (APRESOLINE) 10 MG tablet Take 10 mg by mouth daily as needed (Systolic BP>159 or Diastolic BP >99).   Yes [provider]  hydrochlorothiazide (MICROZIDE) 12.5 MG capsule Take 12.5 mg by mouth daily.   Yes [provider]  lactulose, encephalopathy, (ENULOSE) 10 GM/15ML SOLN Take 30 g by mouth 2 (two) times daily. Hold medication for loose stools   Yes [provider]  Menthol, Topical Analgesic, 4  % GEL Apply 1 Application topically daily. Apply to bilateral shoulders   Yes [provider]  Multiple Vitamin (MULTIVITAMIN WITH MINERALS) TABS tablet Take 1 tablet by mouth daily. Vita fusion gummies   Yes [provider]  ondansetron (ZOFRAN-ODT) 4 MG disintegrating tablet Take 4 mg by mouth every 4 (four) hours as needed for nausea or vomiting.   Yes [provider]  polyethylene glycol (MIRALAX / GLYCOLAX) 17 g packet Take 17 g by mouth daily. Mix in 8oz of juice or water   Yes [provider]  pravastatin (PRAVACHOL) 20 MG tablet Take 20 mg by mouth at bedtime.   Yes [provider]  senna-docusate (SENOKOT-S) 8.6-50 MG tablet Take 2 tablets by mouth 2 (two) times daily. Serve with prune juice   Yes [provider]  sodium chloride (OCEAN) 0.65 % SOLN nasal spray Place 1 spray into both nostrils daily.   Yes [provider]  sodium chloride (OCEAN) 0.65 % SOLN nasal spray Place 1 spray into both nostrils every 6 (six) hours as needed for congestion.   Yes [provider]  bisacodyl (DULCOLAX) 5 MG EC tablet Take 15 mg by mouth daily as needed for moderate constipation. Patient not taking: Reported on 10/11/2023    [provider]  ibuprofen (ADVIL,MOTRIN) 200 MG tablet Take 200 mg by mouth 2 (two) times daily as needed for moderate pain.  Patient not taking: Reported on 10/11/2023    [provider]  magnesium hydroxide (MILK OF MAGNESIA) 400 MG/5ML suspension Take 30 mLs by mouth daily as needed for mild constipation. Patient not taking: Reported on 10/11/2023    [provider]    Physical Exam: Vitals:   10/11/23 0245 10/11/23 0300 10/11/23 0315 10/11/23 0815  BP: (!) 140/82 (!) 143/88 (!) 142/81 124/74  Pulse: 79 69 62 72  Resp: 20 15 13    Temp:   98 F (36.7 C) (!) 97.5 F (36.4 C)  TempSrc:    Oral  SpO2: 99% 97% 94% 97%  Weight:      Height:       Constitutional: NAD, calm,  comfortable Respiratory: clear to auscultation bilaterally, no wheezing, no crackles. Normal respiratory effort. No accessory muscle use.  Room air Cardiovascular: Regular rate and rhythm, no murmurs / rubs / gallops. No extremity edema. 2+ pedal pulses. No carotid bruits.  Abdomen: no tenderness, no masses palpated. No hepatosplenomegaly. Bowel sounds positive but very hypoactive.  Abdomen is soft Musculoskeletal: no clubbing / cyanosis. No joint deformity upper and lower extremities. Good ROM, no contractures. Normal muscle tone.  Skin: no rashes, lesions, ulcers. No induration Neurologic: CN 2-12 grossly intact. Sensation intact, Strength 3/5 x all 4 extremities.  Psychiatric: Awake and oriented times name only.  Pleasant  Data Reviewed:  As per HPI  Assessment and Plan: Ileus likely related to small bowel enteritis Patient pulled out NG tube  and currently abdomen is soft and nondistended-leave NG tube out for now but reevaluate for recurrent distention and nausea and vomiting N.p.o. except for meds D5 normal saline with KCL at 75 cc/h-appears hemoconcentrated based on elevated hemoglobin and BUN.  Also has not anion gap acidosis likely related to GI losses (2300 cc bilious returns noted in ED) Suspect viral etiology to enteritis so will not initiate empiric antibiotics at this time Current abdominal exam is benign and includes nondistended and nontender As needed Zofran for nausea  Right side bladder wall thickening Possibly related to acute infectious process Urinalysis and urine culture need to be obtained If no acute infectious process would recommend consultation to urology in the event patient needs cystoscopy to better characterize  Chronic constipation Possibly precipitating event for patient's enteritis Continue preadmission scheduled MiraLAX and Senokot  History of CVA/schizoaffective disorder Patient resides at skilled nursing facility and is wheelchair-bound Apparently  has a history of prior stroke related also spasms but given presentation we will hold baclofen and Neurontin for now Continue baby aspirin  Hypertension Not on medications prior to admission  Obesity BMI 37 Wheelchair/bedbound in an activity contributing to obesity Albumin 3.5 with total protein 8.1    Advance Care Planning:   Code Status: Full Code   VTE prophylaxis: Lovenox  Consults: None  Family Communication: None  Severity of Illness: The appropriate patient status for this patient is INPATIENT. Inpatient status is judged to be reasonable and necessary in order to provide the required intensity of service to ensure the patient's safety. The patient's presenting symptoms, physical exam findings, and initial radiographic and laboratory data in the context of their chronic comorbidities is felt to place them at high risk for further clinical deterioration. Furthermore, it is not anticipated that the patient will be medically stable for discharge from the hospital within 2 midnights of admission.   * I certify that at the point of admission it is my clinical judgment that the patient will require inpatient hospital care spanning beyond 2 midnights from the point of admission due to high intensity of service, high risk for further deterioration and high frequency of surveillance required.*  Author: Willeen Niece, MD 10/11/2023 4:16 PM  Attending note: Agree with above nurse petitioner's note.  Patient was seen and examined ,  all the lab imaging reviewed independently. This 63 yrs old male with PMH  significant for schizoaffective disorder, CVA, chronic constipation, hypertension, obesity, wheelchair-bound. He was sent to the ER from [place] skilled nursing facility with reports of abdominal pain and an x-ray at the facility showed small bowel obstruction. CT A/P demonstrated ileus in the setting of small bowel enteritis.  There was also an incidental finding of irregular right  bladder wall thickening that could be secondary to malignancy.  NGT was inserted and the bilious output obtained.  Patient pulled out the NG tube now abdomen is soft and nondistended.  Continue bowel rest and IV hydration.  Suspect viral etiology to enteritis so will hold on empiric antibiotics.  Continue IV Zofran as needed for nausea and vomiting.  Will hold on general surgical consult and patient is improving.  For on call review [email or web address].

## 2023-10-11 NOTE — NC FL2 (Signed)
  [place] MEDICAID FL2 LEVEL OF CARE FORM     IDENTIFICATION  Patient Name: Jared Preston Birthdate: [birth year] Sex: male Admission Date (Current Location): 10/11/2023  [facility] and [place] Number:  Producer, television/film/video and Address:  [facility] [place], [address] [place], [place] [address]      Provider Number: [id number]  Attending Physician Name and Address:  Darlin Drop, DO  Relative Name and Phone Number:  Nicholis Stepanek; Brother; [phone]    Current Level of Care: Hospital Recommended Level of Care: Skilled Nursing Facility Prior Approval Number:    Date Approved/Denied:   PASRR Number: [id number]  Discharge Plan: SNF    Current Diagnoses: Patient Active Problem List   Diagnosis Date Noted   Ileus (HCC) 10/11/2023    Orientation RESPIRATION BLADDER Height & Weight     Self, Time, Place  Normal (Room Air) Continent Weight: 287 lb (130.2 kg) Height:  6\' 1"  (185.4 cm)  BEHAVIORAL SYMPTOMS/MOOD NEUROLOGICAL BOWEL NUTRITION STATUS      Continent Diet (Please see dc summary)  AMBULATORY STATUS COMMUNICATION OF NEEDS Skin     Verbally Normal                       Personal Care Assistance Level of Assistance              Functional Limitations Info             SPECIAL CARE FACTORS FREQUENCY  PT (By licensed PT), OT (By licensed OT)     PT Frequency: 5x OT Frequency: 5x            Contractures Contractures Info: Not present    Additional Factors Info  Code Status, Allergies Code Status Info: Full Code Allergies Info: NKA           Current Medications (10/11/2023):  This is the current hospital active medication list Current Facility-Administered Medications  Medication Dose Route Frequency Provider Last Rate Last Admin   dextrose 5 % and 0.9 % NaCl with KCl 20 mEq/L infusion   Intravenous Continuous Russella Dar, NP         Discharge Medications: Please see discharge summary for a list of  discharge medications.  Relevant Imaging Results:  Relevant Lab Results:   Additional Information #[id number]  Marliss Coots, LCSW

## 2023-10-11 NOTE — ED Notes (Signed)
Patient cleaned up and given new linens

## 2023-10-11 NOTE — ED Triage Notes (Signed)
Patient coming from [facility]. Patient has had abdominal pain and had xrays that "confirm a SBO" Patient covered in vomit on arrival to ED

## 2023-10-11 NOTE — TOC Initial Note (Signed)
Transition of Care [facility]) - Initial/Assessment Note    Patient Details  Name: Jared Preston MRN: [id number] Date of Birth: October 12, 1960  Transition of Care [facility]) CM/SW Contact:    Marliss Coots, LCSW Phone Number: 10/11/2023, 9:55 AM  Clinical Narrative:                  9:55 AM CSW called patient's brother (patient oriented x3), Reggie, who confirmed patient is to return to [facility] upon discharge. CSW offered to coordinate ambulance transportation for discharge and informed Reggie of a possible copay. Reggie expressed understanding of this and continued interest in ambulance transportation for discharge.  Expected Discharge Plan: Skilled Nursing Facility Barriers to Discharge: Continued Medical Work up   Patient Goals and CMS Choice            Expected Discharge Plan and Services In-house Referral: Clinical Social Work   Post Acute Care Choice: Skilled Nursing Facility Living arrangements for the past 2 months: Skilled Nursing Facility                                      Prior Living Arrangements/Services Living arrangements for the past 2 months: Skilled Nursing Facility Lives with:: Facility Resident Patient language and need for interpreter reviewed:: Yes        Need for Family Participation in Patient Care: Yes (Comment) Care giver support system in place?: Yes (comment)   Criminal Activity/Legal Involvement Pertinent to Current Situation/Hospitalization: No - Comment as needed  Activities of Daily Living   ADL Screening (condition at time of admission) Independently performs ADLs?: No Does the patient have a NEW difficulty with bathing/dressing/toileting/self-feeding that is expected to last >3 days?: No Does the patient have a NEW difficulty with getting in/out of bed, walking, or climbing stairs that is expected to last >3 days?: No Does the patient have a NEW difficulty with communication that is expected to last >3 days?: No Is the patient  deaf or have difficulty hearing?: No Does the patient have difficulty seeing, even when wearing glasses/contacts?: No Does the patient have difficulty concentrating, remembering, or making decisions?: No  Permission Sought/Granted Permission sought to share information with : Family Supports, Oceanographer granted to share information with : No (Contact information on chart)  Share Information with NAME: Reggie Cloke  Permission granted to share info w AGENCY: [facility]  Permission granted to share info w Relationship: Brother  Permission granted to share info w Contact Information: [phone]  Emotional Assessment       Orientation: : Oriented to Self, Oriented to  Time, Oriented to Place Alcohol / Substance Use: Not Applicable Psych Involvement: No (comment)  Admission diagnosis:  Ileus (HCC) [K56.7] Elevated BUN [R79.9] Elevated random blood glucose level [R73.9] Elevated alkaline phosphatase level [R74.8] Patient Active Problem List   Diagnosis Date Noted   Ileus (HCC) 10/11/2023   PCP:  Grayce Sessions, NP Pharmacy:   [facility] [phone] - [place], [place] - [address] AT [facility] [address] [facility] [address] [employer] [place] [place] [address] Phone: [phone] Fax: [phone]     Social Drivers of Health (SDOH) Social History: SDOH Screenings   Food Insecurity: No Food Insecurity (10/11/2023)  Housing: Low Risk  (10/11/2023)  Transportation Needs: No Transportation Needs (10/11/2023)  Utilities: Not At Risk (10/11/2023)  Depression (PHQ2-9): Low Risk  (09/29/2020)  Tobacco  Use: High Risk (10/11/2023)   SDOH Interventions:     Readmission Risk Interventions     No data to display

## 2023-10-12 ENCOUNTER — Inpatient Hospital Stay (HOSPITAL_COMMUNITY): Payer: Medicaid Other

## 2023-10-12 ENCOUNTER — Encounter (HOSPITAL_COMMUNITY): Payer: Self-pay | Admitting: Internal Medicine

## 2023-10-12 DIAGNOSIS — K567 Ileus, unspecified: Secondary | ICD-10-CM | POA: Diagnosis not present

## 2023-10-12 LAB — BASIC METABOLIC PANEL
Anion gap: 10 (ref 5–15)
BUN: 30 mg/dL — ABNORMAL HIGH (ref 6–20)
CO2: 26 mmol/L (ref 22–32)
Calcium: 8.4 mg/dL — ABNORMAL LOW (ref 8.9–10.3)
Chloride: 102 mmol/L (ref 98–111)
Creatinine, Ser: 0.85 mg/dL (ref 0.61–1.24)
GFR, Estimated: >60 mL/min (ref >60)
Glucose, Bld: 105 mg/dL — ABNORMAL HIGH (ref 70–99)
Potassium: 4 mmol/L (ref 3.5–5.1)
Sodium: 138 mmol/L (ref 135–145)

## 2023-10-12 MED ORDER — SODIUM CHLORIDE 0.9 % IV SOLN
1.0000 g | Freq: Every day | INTRAVENOUS | Status: AC
  Administered 2023-10-12 – 2023-10-14 (×3): 1 g via INTRAVENOUS
  Filled 2023-10-12 (×3): qty 10

## 2023-10-12 NOTE — Plan of Care (Signed)
Patient alert/oriented X4. Patient compliant with medication administration and tolerated IV antibiotics. Patient had a bowel movement, no bloody stools noted and is able to tolerate a clear liquid diet. Patient turned Q2 hours, no complaints at this time. VSS.  Problem: Education: Goal: Knowledge of General Education information will improve Description: Including pain rating scale, medication(s)/side effects and non-pharmacologic comfort measures Outcome: Progressing   Problem: Health Behavior/Discharge Planning: Goal: Ability to manage health-related needs will improve Outcome: Progressing   Problem: Clinical Measurements: Goal: Ability to maintain clinical measurements within normal limits will improve Outcome: Progressing   Problem: Clinical Measurements: Goal: Will remain free from infection Outcome: Progressing   Problem: Clinical Measurements: Goal: Diagnostic test results will improve Outcome: Progressing   Problem: Clinical Measurements: Goal: Respiratory complications will improve Outcome: Progressing   Problem: Clinical Measurements: Goal: Cardiovascular complication will be avoided Outcome: Progressing   Problem: Activity: Goal: Risk for activity intolerance will decrease Outcome: Progressing   Problem: Nutrition: Goal: Adequate nutrition will be maintained Outcome: Progressing   Problem: Coping: Goal: Level of anxiety will decrease Outcome: Progressing   Problem: Elimination: Goal: Will not experience complications related to bowel motility Outcome: Progressing   Problem: Elimination: Goal: Will not experience complications related to urinary retention Outcome: Progressing   Problem: Pain Managment: Goal: General experience of comfort will improve and/or be controlled Outcome: Progressing   Problem: Safety: Goal: Ability to remain free from injury will improve Outcome: Progressing   Problem: Skin Integrity: Goal: Risk for impaired skin  integrity will decrease Outcome: Progressing

## 2023-10-12 NOTE — Consult Note (Signed)
Consult Note  Jared Preston Feb 02, 1961  [id number].    Requesting MD: Dr. Idelle Leech Chief Complaint/Reason for Consult: nausea/vomiting  HPI:  63 y.o. male with medical history significant for chronic constipation, HTN, obesity, paranoid schizophrenia, stroke, wheel chair bound who presented to [facility] ED from his SNF after an xray taken to evaluate abdominal pain showed concern for bowel obstruction. He was having associated nausea and emesis. Work up was significant for CT concerning for ileus in the setting of enteritis. He had an NGT placed with 2L output charted prior to him removing the tube. Today he continues to have very mild nausea but has been drinking several cups of water. No emesis. He has had multiple bowel movements and is passing flatus. He continues to have mild LLQ abdominal pain but improved from admission. He reports history of chronic constipation and in the past had only had one BM per week. More recently he has been having 2-3 bowel movements per week.  He denies previous similar episodes (says he will having nausea and throwing up every "5 years") and denies prior abdominal surgeries.   ROS: Reviewed and as above  Family History  Problem Relation Age of Onset   Congestive Heart Failure Mother    Diverticulitis Mother    COPD Maternal Uncle    Diabetes Maternal Uncle    Cancer Maternal Uncle    Diverticulitis Maternal Grandmother     Past Medical History:  Diagnosis Date   Chronic constipation    Hypertension    Obesity (BMI 30.0-34.9)    Paranoid schizophrenia (HCC)    Stroke [facility])     History reviewed. No pertinent surgical history.  Social History:  reports that he has been smoking cigarettes. He has never used smokeless tobacco. He reports current alcohol use. He reports that he does not use drugs.  Allergies: No Known Allergies  Medications Prior to Admission  Medication Sig Dispense Refill   acetaminophen (TYLENOL) 500 MG tablet Take 1,000 mg  by mouth 2 (two) times daily. Do not exceed 4 grams     acetaminophen (TYLENOL) 500 MG tablet Take 500 mg by mouth every 6 (six) hours as needed for mild pain (pain score 1-3). Do not exceed 4 grams in 24hrs.     ARIPiprazole ER (ABILIFY MAINTENA) 300 MG PRSY prefilled syringe Inject 300 mg into the muscle every 28 (twenty-eight) days.     aspirin EC 81 MG tablet Take 81 mg by mouth daily. Swallow whole.     Baclofen 5 MG TABS Take 5 mg by mouth at bedtime. Shoulder pain     cyanocobalamin (VITAMIN B12) 1000 MCG tablet Take 1,000 mcg by mouth once a week. Give on Tuesday     gabapentin (NEURONTIN) 400 MG capsule Take 400 mg by mouth 2 (two) times daily.     hydrALAZINE (APRESOLINE) 10 MG tablet Take 10 mg by mouth daily as needed (Systolic BP>159 or Diastolic BP >99).     hydrochlorothiazide (MICROZIDE) 12.5 MG capsule Take 12.5 mg by mouth daily.     lactulose, encephalopathy, (ENULOSE) 10 GM/15ML SOLN Take 30 g by mouth 2 (two) times daily. Hold medication for loose stools     Menthol, Topical Analgesic, 4 % GEL Apply 1 Application topically daily. Apply to bilateral shoulders     Multiple Vitamin (MULTIVITAMIN WITH MINERALS) TABS tablet Take 1 tablet by mouth daily. Vita fusion gummies     ondansetron (ZOFRAN-ODT) 4 MG disintegrating tablet Take 4 mg by  mouth every 4 (four) hours as needed for nausea or vomiting.     polyethylene glycol (MIRALAX / GLYCOLAX) 17 g packet Take 17 g by mouth daily. Mix in 8oz of juice or water     pravastatin (PRAVACHOL) 20 MG tablet Take 20 mg by mouth at bedtime.     senna-docusate (SENOKOT-S) 8.6-50 MG tablet Take 2 tablets by mouth 2 (two) times daily. Serve with prune juice     sodium chloride (OCEAN) 0.65 % SOLN nasal spray Place 1 spray into both nostrils daily.     sodium chloride (OCEAN) 0.65 % SOLN nasal spray Place 1 spray into both nostrils every 6 (six) hours as needed for congestion.     bisacodyl (DULCOLAX) 5 MG EC tablet Take 15 mg by mouth daily as  needed for moderate constipation. (Patient not taking: Reported on 10/11/2023)     ibuprofen (ADVIL,MOTRIN) 200 MG tablet Take 200 mg by mouth 2 (two) times daily as needed for moderate pain.  (Patient not taking: Reported on 10/11/2023)     magnesium hydroxide (MILK OF MAGNESIA) 400 MG/5ML suspension Take 30 mLs by mouth daily as needed for mild constipation. (Patient not taking: Reported on 10/11/2023)      Blood pressure (!) 125/90, pulse (!) 57, temperature 98 F (36.7 C), resp. rate 18, height 6\' 1"  (1.854 m), weight 130.2 kg, SpO2 100%. Physical Exam: General: pleasant, WD, male who is laying in bed in NAD HEENT: head is normocephalic, atraumatic.  Sclera are noninjected.  Pupils equal and round. EOMs intact.  Ears and nose without any masses or lesions.  Mouth is pink and moist Lungs: Respiratory effort nonlabored Abd: soft, distension difficult to assess given body habitus, mild TTP LLQ without rebound or guarding MSK: all 4 extremities are symmetrical with no cyanosis, clubbing, or edema. Skin: warm and dry with no masses, lesions, or rashes Neuro: Cranial nerves 2-12 grossly intact, sensation is normal throughout Psych: A&Ox3 with an appropriate affect.    Results for orders placed or performed during the hospital encounter of 10/11/23 (from the past 48 hours)  Comprehensive metabolic panel     Status: Abnormal   Collection Time: 10/11/23  1:04 AM  Result Value Ref Range   Sodium 137 135 - 145 mmol/L   Potassium 3.8 3.5 - 5.1 mmol/L   Chloride 103 98 - 111 mmol/L   CO2 21 (L) 22 - 32 mmol/L   Glucose, Bld 146 (H) 70 - 99 mg/dL    Comment: Glucose reference range applies only to samples taken after fasting for at least 8 hours.   BUN 26 (H) 6 - 20 mg/dL   Creatinine, Ser 1.91 0.61 - 1.24 mg/dL   Calcium 9.0 8.9 - 47.8 mg/dL   Total Protein 8.1 6.5 - 8.1 g/dL   Albumin 3.5 3.5 - 5.0 g/dL   AST 22 15 - 41 U/L   ALT 13 0 - 44 U/L   Alkaline Phosphatase 166 (H) 38 - 126 U/L    Total Bilirubin 0.7 0.0 - 1.2 mg/dL   GFR, Estimated >29 >56 mL/min    Comment: (NOTE) Calculated using the CKD-EPI Creatinine Equation (2021)    Anion gap 13 5 - 15    Comment: Performed at [facility], [address], [place], [place] [address]  Lipase, blood     Status: None   Collection Time: 10/11/23  1:04 AM  Result Value Ref Range   Lipase 18 11 - 51 U/L  Comment: Performed at [facility], [address] [place], [place] [address]  CBC with Differential     Status: None   Collection Time: 10/11/23  1:04 AM  Result Value Ref Range   WBC 6.3 4.0 - 10.5 K/uL   RBC 5.50 4.22 - 5.81 MIL/uL   Hemoglobin 16.5 13.0 - 17.0 g/dL   HCT 52.8 41.3 - 24.4 %   MCV 91.3 80.0 - 100.0 fL   MCH 30.0 26.0 - 34.0 pg   MCHC 32.9 30.0 - 36.0 g/dL   RDW 01.0 27.2 - 53.6 %   Platelets 296 150 - 400 K/uL   nRBC 0.0 0.0 - 0.2 %   Neutrophils Relative % 74 %   Neutro Abs 4.6 1.7 - 7.7 K/uL   Lymphocytes Relative 16 %   Lymphs Abs 1.0 0.7 - 4.0 K/uL   Monocytes Relative 10 %   Monocytes Absolute 0.6 0.1 - 1.0 K/uL   Eosinophils Relative 0 %   Eosinophils Absolute 0.0 0.0 - 0.5 K/uL   Basophils Relative 0 %   Basophils Absolute 0.0 0.0 - 0.1 K/uL   Immature Granulocytes 0 %   Abs Immature Granulocytes 0.01 0.00 - 0.07 K/uL    Comment: Performed at [facility], [address], [place], [place] [address]  Troponin I (High Sensitivity)     Status: None   Collection Time: 10/11/23  1:04 AM  Result Value Ref Range   Troponin I (High Sensitivity) 4 <18 ng/L    Comment: (NOTE) Elevated high sensitivity troponin I (hsTnI) values and significant  changes across serial measurements may suggest ACS but many other  chronic and acute conditions are known to elevate hsTnI results.  Refer to the "Links" section for chest pain algorithms and additional  guidance. Performed at [facility], [address] [place], [place] [address]   Troponin I (High Sensitivity)     Status:  None   Collection Time: 10/11/23  3:36 AM  Result Value Ref Range   Troponin I (High Sensitivity) 5 <18 ng/L    Comment: (NOTE) Elevated high sensitivity troponin I (hsTnI) values and significant  changes across serial measurements may suggest ACS but many other  chronic and acute conditions are known to elevate hsTnI results.  Refer to the "Links" section for chest pain algorithms and additional  guidance. Performed at [facility], [address], [place], [place] [address]   HIV Antibody (routine testing w rflx)     Status: None   Collection Time: 10/11/23 12:28 PM  Result Value Ref Range   HIV Screen 4th Generation wRfx Non Reactive Non Reactive    Comment: Performed at [facility], [address] [place], [place] [address]  Basic metabolic panel     Status: Abnormal   Collection Time: 10/12/23  3:34 AM  Result Value Ref Range   Sodium 138 135 - 145 mmol/L   Potassium 4.0 3.5 - 5.1 mmol/L   Chloride 102 98 - 111 mmol/L   CO2 26 22 - 32 mmol/L   Glucose, Bld 105 (H) 70 - 99 mg/dL    Comment: Glucose reference range applies only to samples taken after fasting for at least 8 hours.   BUN 30 (H) 6 - 20 mg/dL   Creatinine, Ser 3.29 0.61 - 1.24 mg/dL   Calcium 8.4 (L) 8.9 - 10.3 mg/dL   GFR, Estimated >51 >88 mL/min    Comment: (NOTE) Calculated using the  CKD-EPI Creatinine Equation (2021)    Anion gap 10 5 - 15    Comment: Performed at [facility], [address] [place], [place] [address]   DG Abd 1 View Result Date: 10/12/2023 CLINICAL DATA:  [id number] Ileus [facility]) [phone] EXAM: ABDOMEN - 1 VIEW COMPARISON:  10/11/2023 FINDINGS: Progressive dilation of multiple small bowel loops throughout the abdomen now measuring up to 7.0 cm in diameter (previously approximately 4.4 cm). No gross free intraperitoneal air. IMPRESSION: Progressive dilation of multiple small bowel loops throughout the abdomen, concerning for worsening small bowel obstruction. Electronically Signed    By: Duanne Guess D.O.   On: 10/12/2023 10:23   CT ABDOMEN PELVIS W CONTRAST Result Date: 10/11/2023 CLINICAL DATA:  Bowel obstruction suspected. EXAM: CT ABDOMEN AND PELVIS WITH CONTRAST TECHNIQUE: Multidetector CT imaging of the abdomen and pelvis was performed using the standard protocol following bolus administration of intravenous contrast. RADIATION DOSE REDUCTION: This exam was performed according to the departmental dose-optimization program which includes automated exposure control, adjustment of the mA and/or kV according to patient size and/or use of iterative reconstruction technique. CONTRAST:  75mL OMNIPAQUE IOHEXOL 350 MG/ML SOLN COMPARISON:  04/20/2016 FINDINGS: Lower chest: Small right pleural effusion. Hepatobiliary: No acute abnormality. Pancreas: Unremarkable. Spleen: Unremarkable. Adrenals/Urinary Tract: Stable adrenal glands. No urinary calculi or hydronephrosis. Asymmetric right bladder wall thickening and mucosal hyperenhancement. Stomach/Bowel: Enteric tube tip in the stomach. Stomach is within normal limits. Diffuse small bowel dilation with air-fluid levels. No single discrete transition point. There are multiple areas of small-bowel narrowing with associated mild wall thickening. For example in the central abdomen on series 6/image 88, in the left hemiabdomen on series 6/image 73, and in the left hemiabdomen on series 3/image 53. The distal ileum is decompressed. Normal caliber colon containing stool. Normal appendix. Vascular/Lymphatic: No significant vascular findings are present. No enlarged abdominal or pelvic lymph nodes. Reproductive: No acute abnormality. Other: No free intraperitoneal fluid or air. Musculoskeletal: No acute fracture. IMPRESSION: 1. Diffuse small bowel dilation with air-fluid levels. No single discrete transition point. There are multiple areas of small-bowel narrowing with associated mild wall thickening. Findings are favored to represent ileus secondary  to enteritis. 2. Asymmetric right bladder wall thickening and mucosal hyperenhancement. Correlate with cystoscopy to exclude malignancy. 3. Small right pleural effusion. Electronically Signed   By: Minerva Fester M.D.   On: 10/11/2023 02:54      Assessment/Plan Nausea/vomiting Ileus related to enteritis  Patient seen and examined and relevant labs and imaging personally reviewed including xray from today and CT from yesterday which show small bowel dilatation without transition point. He had an NGT placed which is now out but approx 2L of output. Currently he is no longer vomiting, nausea improved, abdominal pain improved, and having bowel movements. WBC normal yesterday (no cbc today) and not peritonitic on exam. He is not clinically obstructed. No urgent/emergent surgical intervention indicated and favor enteritis at this time. He can advance diet as tolerated from our perspective.   FEN: NPO ID: [email or web address] VTE: lovenox   I reviewed ED provider notes, hospitalist notes, last 24 h vitals and pain scores, last 48 h intake and output, last 24 h labs and trends, and last 24 h imaging results.   Eric Form, [facility] 10/12/2023, 12:21 PM Please see Amion for pager number during day hours 7:00am-4:30pm

## 2023-10-12 NOTE — Progress Notes (Signed)
PROGRESS NOTE    HAARIS METALLO  ZOX:[id number] DOB: Jul 15, 1961 DOA: 10/11/2023 PCP: Grayce Sessions, NP   Brief Narrative:  This 63 yrs old male with PMH  significant for schizoaffective disorder, CVA, chronic constipation, hypertension, obesity, wheelchair-bound. He was sent to the ER from [place] skilled nursing facility with reports of abdominal pain and an x-ray at the facility showed small bowel obstruction. CT A/P demonstrated ileus in the setting of small bowel enteritis.  There was also an incidental finding of irregular right bladder wall thickening that could be secondary to malignancy.  NGT was inserted and the bilious output obtained.  Patient pulled out the NG tube now abdomen is soft and nondistended.  Patient was admitted for further evaluation.  Assessment & Plan:   Principal Problem:   Ileus (HCC)   Ileus likely related to small bowel enteritis: SBO ?? Patient presented with mild abdominal pain, x-ray at SNF shows findings consistent with small bowel obstruction. Patient pulled out NG tube and currently abdomen is soft and non distended. Patient denies any nausea and vomiting. Continue bowel rest, IV hydration. Suspect viral etiology to enteritis so will not initiate empiric antibiotics at this time. Repeat x-ray shows worsening small bowel obstruction. As needed Zofran for nausea. General surgery consulted.   Right side bladder wall thickening: Possibly related to acute infectious process. UA shows large leukocytes.  Patient denies any urinary symptoms. Would consult urology to see if he needs any intervention.  Chronic constipation Possibly precipitating event for patient's enteritis. Continue MiraLAX and Senokot.   History of CVA / Schizoaffective disorder: Patient resides at skilled nursing facility and is wheelchair-bound Apparently has a history of prior stroke related also spasms but given presentation we will hold baclofen and Neurontin for  now. Continue baby aspirin   Hypertension: Blood pressure better controlled off medications.   Obesity BMI 37 Wheelchair/bedbound . Discussed diet in detail.   DVT prophylaxis: Lovenox Code Status: Full code Family Communication: No family at bedside. Disposition Plan:    Status is: Inpatient Remains inpatient appropriate because: Admitted for small bowel obstruction     Consultants:  General surgery  Procedures: Xray Abdomen  Antimicrobials:  Anti-infectives (From admission, onward)    None      Subjective: Patient was seen and examined at bedside.  Overnight events noted.   Patient reports feeling improved. He is able to pass flatus,  had a very little small bowel movement.   Objective: Vitals:   10/11/23 0815 10/11/23 1651 10/11/23 2045 10/12/23 0750  BP: 124/74 119/79 118/79 (!) 125/90  Pulse: 72 72 74 (!) 57  Resp:   18   Temp: (!) 97.5 F (36.4 C) 98 F (36.7 C) 98.3 F (36.8 C) 98 F (36.7 C)  TempSrc: Oral     SpO2: 97% 97% 99% 100%  Weight:      Height:        Intake/Output Summary (Last 24 hours) at 10/12/2023 1202 Last data filed at 10/12/2023 0300 Gross per 24 hour  Intake 468 ml  Output --  Net 468 ml   Filed Weights   10/11/23 0058  Weight: 130.2 kg    Examination:  General exam: Appears calm and comfortable, morbidly obese, not in any acute distress. Respiratory system: CTA bilaterally. Respiratory effort normal.  RR 16. Cardiovascular system: S1 & S2 heard, RRR. No JVD, murmurs, rubs, gallops or clicks.  Gastrointestinal system: Abdomen is non distended, soft and mildly tender. Normal bowel sounds heard. Central nervous system:  Alert and oriented x 3. No focal neurological deficits. Extremities: No edema, no cyanosis, no clubbing. Skin: No rashes, lesions or ulcers Psychiatry: Judgement and insight appear normal. Mood & affect appropriate.     Data Reviewed: I have personally reviewed following labs and imaging  studies  CBC: Recent Labs  Lab 10/11/23 0104  WBC 6.3  NEUTROABS 4.6  HGB 16.5  HCT 50.2  MCV 91.3  PLT 296   Basic Metabolic Panel: Recent Labs  Lab 10/11/23 0104 10/12/23 0334  NA 137 138  K 3.8 4.0  CL 103 102  CO2 21* 26  GLUCOSE 146* 105*  BUN 26* 30*  CREATININE 0.78 0.85  CALCIUM 9.0 8.4*   GFR: Estimated Creatinine Clearance: 130.7 mL/min (by C-G formula based on SCr of 0.85 mg/dL). Liver Function Tests: Recent Labs  Lab 10/11/23 0104  AST 22  ALT 13  ALKPHOS 166*  BILITOT 0.7  PROT 8.1  ALBUMIN 3.5   Recent Labs  Lab 10/11/23 0104  LIPASE 18   No results for input(s): "AMMONIA" in the last 168 hours. Coagulation Profile: No results for input(s): "INR", "PROTIME" in the last 168 hours. Cardiac Enzymes: No results for input(s): "CKTOTAL", "CKMB", "CKMBINDEX", "TROPONINI" in the last 168 hours. BNP (last 3 results) No results for input(s): "PROBNP" in the last 8760 hours. HbA1C: No results for input(s): "HGBA1C" in the last 72 hours. CBG: No results for input(s): "GLUCAP" in the last 168 hours. Lipid Profile: No results for input(s): "CHOL", "HDL", "LDLCALC", "TRIG", "CHOLHDL", "LDLDIRECT" in the last 72 hours. Thyroid Function Tests: No results for input(s): "TSH", "T4TOTAL", "FREET4", "T3FREE", "THYROIDAB" in the last 72 hours. Anemia Panel: No results for input(s): "VITAMINB12", "FOLATE", "FERRITIN", "TIBC", "IRON", "RETICCTPCT" in the last 72 hours. Sepsis Labs: No results for input(s): "PROCALCITON", "LATICACIDVEN" in the last 168 hours.  No results found for this or any previous visit (from the past 240 hours).   Radiology Studies: DG Abd 1 View Result Date: 10/12/2023 CLINICAL DATA:  [id number] Ileus [facility]) [phone] EXAM: ABDOMEN - 1 VIEW COMPARISON:  10/11/2023 FINDINGS: Progressive dilation of multiple small bowel loops throughout the abdomen now measuring up to 7.0 cm in diameter (previously approximately 4.4 cm). No gross free  intraperitoneal air. IMPRESSION: Progressive dilation of multiple small bowel loops throughout the abdomen, concerning for worsening small bowel obstruction. Electronically Signed   By: Duanne Guess D.O.   On: 10/12/2023 10:23   CT ABDOMEN PELVIS W CONTRAST Result Date: 10/11/2023 CLINICAL DATA:  Bowel obstruction suspected. EXAM: CT ABDOMEN AND PELVIS WITH CONTRAST TECHNIQUE: Multidetector CT imaging of the abdomen and pelvis was performed using the standard protocol following bolus administration of intravenous contrast. RADIATION DOSE REDUCTION: This exam was performed according to the departmental dose-optimization program which includes automated exposure control, adjustment of the mA and/or kV according to patient size and/or use of iterative reconstruction technique. CONTRAST:  75mL OMNIPAQUE IOHEXOL 350 MG/ML SOLN COMPARISON:  04/20/2016 FINDINGS: Lower chest: Small right pleural effusion. Hepatobiliary: No acute abnormality. Pancreas: Unremarkable. Spleen: Unremarkable. Adrenals/Urinary Tract: Stable adrenal glands. No urinary calculi or hydronephrosis. Asymmetric right bladder wall thickening and mucosal hyperenhancement. Stomach/Bowel: Enteric tube tip in the stomach. Stomach is within normal limits. Diffuse small bowel dilation with air-fluid levels. No single discrete transition point. There are multiple areas of small-bowel narrowing with associated mild wall thickening. For example in the central abdomen on series 6/image 88, in the left hemiabdomen on series 6/image 73, and in the left hemiabdomen on series 3/image 53.  The distal ileum is decompressed. Normal caliber colon containing stool. Normal appendix. Vascular/Lymphatic: No significant vascular findings are present. No enlarged abdominal or pelvic lymph nodes. Reproductive: No acute abnormality. Other: No free intraperitoneal fluid or air. Musculoskeletal: No acute fracture. IMPRESSION: 1. Diffuse small bowel dilation with air-fluid  levels. No single discrete transition point. There are multiple areas of small-bowel narrowing with associated mild wall thickening. Findings are favored to represent ileus secondary to enteritis. 2. Asymmetric right bladder wall thickening and mucosal hyperenhancement. Correlate with cystoscopy to exclude malignancy. 3. Small right pleural effusion. Electronically Signed   By: Minerva Fester M.D.   On: 10/11/2023 02:54    Scheduled Meds:  acetaminophen  1,000 mg Oral BID   aspirin EC  81 mg Oral Daily   enoxaparin (LOVENOX) injection  40 mg Subcutaneous Q24H   polyethylene glycol  17 g Oral Daily   senna-docusate  2 tablet Oral BID   Continuous Infusions:  dextrose 5 % and 0.9 % NaCl 1,000 mL with potassium chloride 20 mEq infusion 40 mL/hr at 10/11/23 1518     LOS: 1 day    Time spent: 35 Mins    Willeen Niece, MD [facility]   If 7PM-7AM, please contact night-coverage

## 2023-10-12 NOTE — Progress Notes (Signed)
Jared Preston has refused Vital Signs. He is upset because he is NPO and cannot get anything to eat or drink. Despite education, patient wants to speak to a doctor in day shift then wants to leave.

## 2023-10-12 NOTE — Plan of Care (Signed)
Problem: Education: Goal: Knowledge of General Education information will improve Description: Including pain rating scale, medication(s)/side effects and non-pharmacologic comfort measures Outcome: Progressing   Problem: Health Behavior/Discharge Planning: Goal: Ability to manage health-related needs will improve Outcome: Progressing   Problem: Clinical Measurements: Goal: Ability to maintain clinical measurements within normal limits will improve Outcome: Progressing   Problem: Clinical Measurements: Goal: Will remain free from infection Outcome: Progressing   Problem: Clinical Measurements: Goal: Diagnostic test results will improve Outcome: Progressing   Problem: Clinical Measurements: Goal: Respiratory complications will improve Outcome: Progressing   Problem: Clinical Measurements: Goal: Cardiovascular complication will be avoided Outcome: Progressing   Problem: Activity: Goal: Risk for activity intolerance will decrease Outcome: Progressing   Problem: Nutrition: Goal: Adequate nutrition will be maintained Outcome: Progressing   Problem: Coping: Goal: Level of anxiety will decrease Outcome: Progressing   Problem: Elimination: Goal: Will not experience complications related to bowel motility Outcome: Progressing   Problem: Pain Managment: Goal: General experience of comfort will improve Outcome: Progressing   Problem: Safety: Goal: Ability to remain free from injury will improve Outcome: Progressing   Problem: Skin Integrity: Goal: Risk for impaired skin integrity will decrease Outcome: Progressing   Problem: Activity: Goal: Ability to tolerate increased activity will improve Outcome: Progressing   Problem: Clinical Measurements: Goal: Ability to maintain a body temperature in the normal range will improve Outcome: Progressing   Problem: Respiratory: Goal: Ability to maintain adequate ventilation will improve Outcome: Progressing   Problem:  Respiratory: Goal: Ability to maintain a clear airway will improve Outcome: Progressing   Problem: Urinary Elimination: Goal: Signs and symptoms of infection will decrease Outcome: Progressing   Problem: Education: Goal: Knowledge of the prescribed therapeutic regimen will improve Outcome: Progressing   Problem: Coping: Goal: Ability to identify and develop effective coping behavior will improve Outcome: Progressing   Problem: Clinical Measurements: Goal: Quality of life will improve Outcome: Progressing   Problem: Respiratory: Goal: Verbalizations of increased ease of respirations will increase Outcome: Progressing   Problem: Role Relationship: Goal: Family's ability to cope with current situation will improve Outcome: Progressing   Problem: Role Relationship: Goal: Ability to verbalize concerns, feelings, and thoughts to partner or family member will improve Outcome: Progressing   Problem: Pain Management: Goal: Satisfaction with pain management regimen will improve Outcome: Progressing

## 2023-10-13 DIAGNOSIS — K567 Ileus, unspecified: Secondary | ICD-10-CM | POA: Diagnosis not present

## 2023-10-13 LAB — CBC
HCT: 41.4 % (ref 39.0–52.0)
Hemoglobin: 13.6 g/dL (ref 13.0–17.0)
MCH: 30.5 pg (ref 26.0–34.0)
MCHC: 32.9 g/dL (ref 30.0–36.0)
MCV: 92.8 fL (ref 80.0–100.0)
Platelets: 241 10*3/uL (ref 150–400)
RBC: 4.46 MIL/uL (ref 4.22–5.81)
RDW: 13.4 % (ref 11.5–15.5)
WBC: 8 10*3/uL (ref 4.0–10.5)
nRBC: 0 % (ref 0.0–0.2)

## 2023-10-13 LAB — BASIC METABOLIC PANEL
Anion gap: 9 (ref 5–15)
BUN: 15 mg/dL (ref 6–20)
CO2: 24 mmol/L (ref 22–32)
Calcium: 8 mg/dL — ABNORMAL LOW (ref 8.9–10.3)
Chloride: 98 mmol/L (ref 98–111)
Creatinine, Ser: 0.54 mg/dL — ABNORMAL LOW (ref 0.61–1.24)
GFR, Estimated: >60 mL/min (ref >60)
Glucose, Bld: 91 mg/dL (ref 70–99)
Potassium: 3.3 mmol/L — ABNORMAL LOW (ref 3.5–5.1)
Sodium: 131 mmol/L — ABNORMAL LOW (ref 135–145)

## 2023-10-13 LAB — MAGNESIUM: Magnesium: 2.3 mg/dL (ref 1.7–2.4)

## 2023-10-13 LAB — PHOSPHORUS: Phosphorus: 2.6 mg/dL (ref 2.5–4.6)

## 2023-10-13 MED ORDER — POTASSIUM CHLORIDE 20 MEQ PO PACK
40.0000 meq | PACK | Freq: Once | ORAL | Status: AC
  Administered 2023-10-13: 40 meq via ORAL
  Filled 2023-10-13: qty 2

## 2023-10-13 MED ORDER — SODIUM CHLORIDE 0.9 % IV SOLN: INTRAVENOUS | Status: DC

## 2023-10-13 NOTE — TOC Progression Note (Signed)
Transition of Care [facility]) - Progression Note    Patient Details  Name: Jared Preston MRN: [id number] Date of Birth: 1961-06-23  Transition of Care [facility]) CM/SW Contact  Marliss Coots, LCSW Phone Number: 10/13/2023, 12:20 PM  Clinical Narrative:     12:20 PM Per hospitalist, patient would be able to discharge to [facility] tomorrow via ambulance transportation if able to tolerate soft diet. Medical team and SNF are aware of expected discharge date.   Expected Discharge Plan: Skilled Nursing Facility Barriers to Discharge: Continued Medical Work up  Expected Discharge Plan and Services In-house Referral: Clinical Social Work   Post Acute Care Choice: Skilled Nursing Facility Living arrangements for the past 2 months: Skilled Nursing Facility                                       Social Determinants of Health (SDOH) Interventions SDOH Screenings   Food Insecurity: No Food Insecurity (10/11/2023)  Housing: Low Risk  (10/11/2023)  Transportation Needs: No Transportation Needs (10/11/2023)  Utilities: Not At Risk (10/11/2023)  Depression (PHQ2-9): Low Risk  (09/29/2020)  Tobacco Use: High Risk (10/11/2023)    Readmission Risk Interventions     No data to display

## 2023-10-13 NOTE — Plan of Care (Signed)

## 2023-10-13 NOTE — Plan of Care (Signed)
Patient alert/oriented X4. Patient compliant with medication administration and turned Q2 hours as tolerable. Patient had a bowel movement this shift and tolerated soft diet. VSS, no complaints at this time.   Problem: Education: Goal: Knowledge of General Education information will improve Description: Including pain rating scale, medication(s)/side effects and non-pharmacologic comfort measures Outcome: Progressing   Problem: Health Behavior/Discharge Planning: Goal: Ability to manage health-related needs will improve Outcome: Progressing   Problem: Clinical Measurements: Goal: Ability to maintain clinical measurements within normal limits will improve Outcome: Progressing   Problem: Clinical Measurements: Goal: Will remain free from infection Outcome: Progressing   Problem: Clinical Measurements: Goal: Diagnostic test results will improve Outcome: Progressing   Problem: Clinical Measurements: Goal: Respiratory complications will improve Outcome: Progressing   Problem: Clinical Measurements: Goal: Cardiovascular complication will be avoided Outcome: Progressing   Problem: Activity: Goal: Risk for activity intolerance will decrease Outcome: Progressing   Problem: Nutrition: Goal: Adequate nutrition will be maintained Outcome: Progressing   Problem: Coping: Goal: Level of anxiety will decrease Outcome: Progressing   Problem: Elimination: Goal: Will not experience complications related to bowel motility Outcome: Progressing   Problem: Elimination: Goal: Will not experience complications related to urinary retention Outcome: Progressing   Problem: Pain Managment: Goal: General experience of comfort will improve and/or be controlled Outcome: Progressing   Problem: Safety: Goal: Ability to remain free from injury will improve Outcome: Progressing   Problem: Skin Integrity: Goal: Risk for impaired skin integrity will decrease Outcome: Progressing

## 2023-10-13 NOTE — Progress Notes (Signed)
PROGRESS NOTE    Jared Preston  ZOX:[id number] DOB: February 18, 1961 DOA: 10/11/2023 PCP: Grayce Sessions, NP   Brief Narrative:  This 63 yrs old male with PMH  significant for schizoaffective disorder, CVA, chronic constipation, hypertension, obesity, wheelchair-bound. He was sent to the ER from [place] skilled nursing facility with reports of abdominal pain and an x-ray at the facility showed small bowel obstruction. CT A/P demonstrated ileus in the setting of small bowel enteritis.  There was also an incidental finding of irregular right bladder wall thickening that could be secondary to malignancy.  NGT was inserted and the bilious output obtained.  Patient pulled out the NG tube now abdomen is soft and nondistended.  Patient was admitted for further evaluation.  Assessment & Plan:   Principal Problem:   Ileus (HCC)   Ileus likely related to small bowel enteritis: SBO ?? Patient presented with mild abdominal pain, x-ray at SNF shows findings consistent with small bowel obstruction. Patient pulled out NG tube and currently abdomen is soft and non distended. Patient denies any nausea and vomiting. Continue bowel rest, IV hydration. Suspect viral etiology to enteritis so will not initiate empiric antibiotics at this time. Repeat x-ray shows worsening small bowel obstruction. As needed Zofran for nausea. General surgery consulted.  Patient has had 2 bowel movements.  CT appears to be related to enteritis,  not bowel obstruction.  No need for surgery at this time,  start clear liquid diet and advance as tolerated.   Right side bladder wall thickening: Possibly related to acute infectious process. UA shows large leukocytes.  Patient denies any urinary symptoms. Would consult urology to see if he needs any intervention. Start ceftriaxone for 3 days.  Chronic constipation Possibly precipitating event for patient's enteritis.. Continue MiraLAX and Senokot.   History of CVA /  Schizoaffective disorder: Patient resides at skilled nursing facility and is wheelchair-bound Apparently has a history of prior stroke related also spasms but given presentation we will hold baclofen and Neurontin for now. Continue baby aspirin   Hypertension: Blood pressure better controlled off medications.   Obesity BMI 37 Wheelchair/bedbound . Discussed diet in detail.   DVT prophylaxis: Lovenox Code Status: Full code Family Communication: No family at bedside. Disposition Plan:    Status is: Inpatient Remains inpatient appropriate because: Admitted for small bowel obstruction, anticipated discharge home tomorrow.   Consultants:  General surgery  Procedures: Xray Abdomen  Antimicrobials:  Anti-infectives (From admission, onward)    Start     Dose/Rate Route Frequency Ordered Stop   10/12/23 1300  cefTRIAXone (ROCEPHIN) 1 g in sodium chloride 0.9 % 100 mL IVPB        1 g 200 mL/hr over 30 Minutes Intravenous Daily 10/12/23 1202 10/15/23 0959      Subjective: Patient was seen and examined at bedside.  Overnight events noted.   Patient reports feeling better, he has few watery bowel movements . He reports feeling hungry and wants to eat, denies nausea and vomiting.   Objective: Vitals:   10/12/23 2037 10/12/23 2038 10/13/23 0521 10/13/23 0744  BP:  132/88 108/69 (!) 140/89  Pulse:  (!) 59 65 (!) 51  Resp:  18 18 16   Temp: 98 F (36.7 C)  98.6 F (37 C) 97.8 F (36.6 C)  TempSrc: Oral  Oral Oral  SpO2:  99% 100% 98%  Weight:      Height:        Intake/Output Summary (Last 24 hours) at 10/13/2023 1417 Last data  filed at 10/12/2023 2101 Gross per 24 hour  Intake 100 ml  Output --  Net 100 ml   Filed Weights   10/11/23 0058  Weight: 130.2 kg    Examination:  General exam: Appears comfortable, morbidly obese, not in any acute distress. Respiratory system: CTA bilaterally. Respiratory effort normal.  RR 14. Cardiovascular system: S1 & S2 heard, RRR.  No JVD, murmurs, rubs, gallops or clicks.  Gastrointestinal system: Abdomen is non distended, soft and mildly tender. Normal bowel sounds heard. Central nervous system: Alert and oriented x 3. No focal neurological deficits. Extremities: No edema, no cyanosis, no clubbing. Skin: No rashes, lesions or ulcers Psychiatry: Judgement and insight appear normal. Mood & affect appropriate.     Data Reviewed: I have personally reviewed following labs and imaging studies  CBC: Recent Labs  Lab 10/11/23 0104 10/13/23 0505  WBC 6.3 8.0  NEUTROABS 4.6  --   HGB 16.5 13.6  HCT 50.2 41.4  MCV 91.3 92.8  PLT 296 241   Basic Metabolic Panel: Recent Labs  Lab 10/11/23 0104 10/12/23 0334 10/13/23 0505  NA 137 138 131*  K 3.8 4.0 3.3*  CL 103 102 98  CO2 21* 26 24  GLUCOSE 146* 105* 91  BUN 26* 30* 15  CREATININE 0.78 0.85 0.54*  CALCIUM 9.0 8.4* 8.0*  MG  --   --  2.3  PHOS  --   --  2.6   GFR: Estimated Creatinine Clearance: 138.9 mL/min (A) (by C-G formula based on SCr of 0.54 mg/dL (L)). Liver Function Tests: Recent Labs  Lab 10/11/23 0104  AST 22  ALT 13  ALKPHOS 166*  BILITOT 0.7  PROT 8.1  ALBUMIN 3.5   Recent Labs  Lab 10/11/23 0104  LIPASE 18   No results for input(s): "AMMONIA" in the last 168 hours. Coagulation Profile: No results for input(s): "INR", "PROTIME" in the last 168 hours. Cardiac Enzymes: No results for input(s): "CKTOTAL", "CKMB", "CKMBINDEX", "TROPONINI" in the last 168 hours. BNP (last 3 results) No results for input(s): "PROBNP" in the last 8760 hours. HbA1C: No results for input(s): "HGBA1C" in the last 72 hours. CBG: No results for input(s): "GLUCAP" in the last 168 hours. Lipid Profile: No results for input(s): "CHOL", "HDL", "LDLCALC", "TRIG", "CHOLHDL", "LDLDIRECT" in the last 72 hours. Thyroid Function Tests: No results for input(s): "TSH", "T4TOTAL", "FREET4", "T3FREE", "THYROIDAB" in the last 72 hours. Anemia Panel: No results  for input(s): "VITAMINB12", "FOLATE", "FERRITIN", "TIBC", "IRON", "RETICCTPCT" in the last 72 hours. Sepsis Labs: No results for input(s): "PROCALCITON", "LATICACIDVEN" in the last 168 hours.  No results found for this or any previous visit (from the past 240 hours).   Radiology Studies: DG Abd 1 View Result Date: 10/12/2023 CLINICAL DATA:  [id number] Ileus [facility]) [phone] EXAM: ABDOMEN - 1 VIEW COMPARISON:  10/11/2023 FINDINGS: Progressive dilation of multiple small bowel loops throughout the abdomen now measuring up to 7.0 cm in diameter (previously approximately 4.4 cm). No gross free intraperitoneal air. IMPRESSION: Progressive dilation of multiple small bowel loops throughout the abdomen, concerning for worsening small bowel obstruction. Electronically Signed   By: Duanne Guess D.O.   On: 10/12/2023 10:23    Scheduled Meds:  acetaminophen  1,000 mg Oral BID   aspirin EC  81 mg Oral Daily   enoxaparin (LOVENOX) injection  40 mg Subcutaneous Q24H   polyethylene glycol  17 g Oral Daily   senna-docusate  2 tablet Oral BID   Continuous Infusions:  cefTRIAXone (ROCEPHIN)  IV 1 g (10/13/23 0933)     LOS: 2 days    Time spent: 35 Mins    Willeen Niece, MD [facility]   If 7PM-7AM, please contact night-coverage

## 2023-10-14 DIAGNOSIS — K567 Ileus, unspecified: Secondary | ICD-10-CM | POA: Diagnosis not present

## 2023-10-14 LAB — CBC
HCT: 40.3 % (ref 39.0–52.0)
Hemoglobin: 13.1 g/dL (ref 13.0–17.0)
MCH: 29.7 pg (ref 26.0–34.0)
MCHC: 32.5 g/dL (ref 30.0–36.0)
MCV: 91.4 fL (ref 80.0–100.0)
Platelets: 243 10*3/uL (ref 150–400)
RBC: 4.41 MIL/uL (ref 4.22–5.81)
RDW: 13.2 % (ref 11.5–15.5)
WBC: 8.9 10*3/uL (ref 4.0–10.5)
nRBC: 0 % (ref 0.0–0.2)

## 2023-10-14 LAB — BASIC METABOLIC PANEL
Anion gap: 10 (ref 5–15)
BUN: 10 mg/dL (ref 6–20)
CO2: 22 mmol/L (ref 22–32)
Calcium: 8.2 mg/dL — ABNORMAL LOW (ref 8.9–10.3)
Chloride: 102 mmol/L (ref 98–111)
Creatinine, Ser: 0.61 mg/dL (ref 0.61–1.24)
GFR, Estimated: >60 mL/min (ref >60)
Glucose, Bld: 83 mg/dL (ref 70–99)
Potassium: 3.5 mmol/L (ref 3.5–5.1)
Sodium: 134 mmol/L — ABNORMAL LOW (ref 135–145)

## 2023-10-14 LAB — PHOSPHORUS: Phosphorus: 2.4 mg/dL — ABNORMAL LOW (ref 2.5–4.6)

## 2023-10-14 LAB — MAGNESIUM: Magnesium: 2.1 mg/dL (ref 1.7–2.4)

## 2023-10-14 NOTE — Discharge Instructions (Signed)
Patient has symptoms associated with enteritis , Small bowel obstruction ruled out.

## 2023-10-14 NOTE — Plan of Care (Signed)
   Problem: Education: Goal: Knowledge of General Education information will improve Description: Including pain rating scale, medication(s)/side effects and non-pharmacologic comfort measures Outcome: Progressing   Problem: Health Behavior/Discharge Planning: Goal: Ability to manage health-related needs will improve Outcome: Progressing   Problem: Clinical Measurements: Goal: Ability to maintain clinical measurements within normal limits will improve Outcome: Progressing Goal: Will remain free from infection Outcome: Progressing Goal: Diagnostic test results will improve Outcome: Progressing Goal: Respiratory complications will improve Outcome: Progressing Goal: Cardiovascular complication will be avoided Outcome: Progressing   Problem: Activity: Goal: Risk for activity intolerance will decrease Outcome: Progressing   Problem: Nutrition: Goal: Adequate nutrition will be maintained Outcome: Progressing   Problem: Pain Managment: Goal: General experience of comfort will improve and/or be controlled Outcome: Progressing   Problem: Safety: Goal: Ability to remain free from injury will improve Outcome: Progressing   Problem: Skin Integrity: Goal: Risk for impaired skin integrity will decrease Outcome: Progressing

## 2023-10-14 NOTE — Progress Notes (Signed)
Patient discharged back to [facility] [phone]. Called report to nurse.  Answered questions.  Nurse familiar with patient.  Provided PTAR with AVS and medical necessity form who  loaded up patient and left without incident.  PIV removed before discharge.

## 2023-10-14 NOTE — Plan of Care (Signed)

## 2023-10-14 NOTE — Discharge Summary (Signed)
Physician Discharge Summary  Jared Preston ZOX:[id number] DOB: 28-Nov-1960 DOA: 10/11/2023  PCP: Grayce Sessions, NP  Admit date: 10/11/2023  Discharge date: 10/14/2023  Admitted From: SNF  Disposition: SNF  Recommendations for Outpatient Follow-up:  Follow up with PCP in 1-2 weeks. Please obtain BMP/CBC in one week. Patient has symptoms associated with enteritis , Small bowel obstruction ruled out.  Home Health: None Equipment/Devices:None  Discharge Condition: Stable CODE STATUS:Full code Diet recommendation: Heart Healthy   Brief Summary / Hospital Course: This 63 yrs old male with PMH significant for schizoaffective disorder, CVA, chronic constipation, hypertension, obesity, wheelchair-bound. He was sent to the ER from [facility] skilled nursing facility with reports of abdominal pain and an x-ray at the facility showed small bowel obstruction. CT A/P demonstrated ileus in the setting of small bowel enteritis.  There was also an incidental finding of irregular right bladder wall thickening that could be secondary to malignancy.  NGT was inserted and the bilious output obtained.  Patient pulled out the NG tube now abdomen is soft and nondistended.  Patient was admitted for further evaluation.  Patient was continued on bowel rest and subsequently started on clear liquid diet tolerated well advance to soft diet tolerated well.  General surgery was consulted findings were consistent with enteritis small bowel obstruction was ruled out.  Patient feels much better and patient is being discharged back to skilled nursing facility.  Discharge Diagnoses:  Principal Problem:   Ileus (HCC)  Ileus likely related to small bowel enteritis: SBO ?? Patient presented with mild abdominal pain, x-ray at SNF shows findings consistent with small bowel obstruction. Patient pulled out NG tube and currently abdomen is soft and non distended. Patient denies any nausea and vomiting. Continue bowel rest,  IV hydration. Suspect viral etiology to enteritis so will not initiate empiric antibiotics at this time. Repeat x-ray shows worsening small bowel obstruction. As needed Zofran for nausea. General surgery consulted.  Patient has had 2 bowel movements.   CT appears to be related to enteritis,  not bowel obstruction.  No need for surgery at this time,  start clear liquid diet and advance as tolerated. Patient tolerated soft bland diet, SBO and ileus resolved.   Right side bladder wall thickening: Possibly related to acute infectious process. UA shows large leukocytes.  Patient denies any urinary symptoms. Would consult urology to see if he needs any intervention. Patient completed ceftriaxone for 3 days.   Chronic constipation Possibly precipitating event for patient's enteritis.. Continue MiraLAX and Senokot.   History of CVA / Schizoaffective disorder: Patient resides at skilled nursing facility and is wheelchair-bound Apparently has a history of prior stroke related also spasms but given presentation we will hold baclofen and Neurontin for now. Continue baby aspirin   Hypertension: Blood pressure better controlled off medications.   Obesity: BMI 37 Wheelchair/bedbound . Discussed diet in detail.  Discharge Instructions  Discharge Instructions     Call MD for:  difficulty breathing, headache or visual disturbances   Complete by: As directed    Call MD for:  persistant dizziness or light-headedness   Complete by: As directed    Call MD for:  persistant nausea and vomiting   Complete by: As directed    Diet - low sodium heart healthy   Complete by: As directed    Diet Carb Modified   Complete by: As directed    Discharge instructions   Complete by: As directed    Advised to follow-up with primary care physician  in 1 week. Patient has symptoms associated with enteritis , Small bowel obstruction ruled out.   Increase activity slowly   Complete by: As directed        Allergies as of 10/14/2023   No Known Allergies      Medication List     STOP taking these medications    bisacodyl 5 MG EC tablet Commonly known as: DULCOLAX   ibuprofen 200 MG tablet Commonly known as: ADVIL   magnesium hydroxide 400 MG/5ML suspension Commonly known as: MILK OF MAGNESIA       TAKE these medications    acetaminophen 500 MG tablet Commonly known as: TYLENOL Take 1,000 mg by mouth 2 (two) times daily. Do not exceed 4 grams What changed: Another medication with the same name was removed. Continue taking this medication, and follow the directions you see here.   ARIPiprazole ER 300 MG Prsy prefilled syringe Commonly known as: ABILIFY MAINTENA Inject 300 mg into the muscle every 28 (twenty-eight) days.   aspirin EC 81 MG tablet Take 81 mg by mouth daily. Swallow whole.   Baclofen 5 MG Tabs Take 5 mg by mouth at bedtime. Shoulder pain   cyanocobalamin 1000 MCG tablet Commonly known as: VITAMIN B12 Take 1,000 mcg by mouth once a week. Give on Tuesday   Enulose 10 GM/15ML Soln Generic drug: lactulose (encephalopathy) Take 30 g by mouth 2 (two) times daily. Hold medication for loose stools   gabapentin 400 MG capsule Commonly known as: NEURONTIN Take 400 mg by mouth 2 (two) times daily.   hydrALAZINE 10 MG tablet Commonly known as: APRESOLINE Take 10 mg by mouth daily as needed (Systolic BP>159 or Diastolic BP >99).   hydrochlorothiazide 12.5 MG capsule Commonly known as: MICROZIDE Take 12.5 mg by mouth daily.   Menthol (Topical Analgesic) 4 % Gel Apply 1 Application topically daily. Apply to bilateral shoulders   multivitamin with minerals Tabs tablet Take 1 tablet by mouth daily. Vita fusion gummies   ondansetron 4 MG disintegrating tablet Commonly known as: ZOFRAN-ODT Take 4 mg by mouth every 4 (four) hours as needed for nausea or vomiting.   polyethylene glycol 17 g packet Commonly known as: MIRALAX / GLYCOLAX Take 17 g by mouth  daily. Mix in 8oz of juice or water   pravastatin 20 MG tablet Commonly known as: PRAVACHOL Take 20 mg by mouth at bedtime.   senna-docusate 8.6-50 MG tablet Commonly known as: Senokot-S Take 2 tablets by mouth 2 (two) times daily. Serve with prune juice   sodium chloride 0.65 % Soln nasal spray Commonly known as: OCEAN Place 1 spray into both nostrils daily.   sodium chloride 0.65 % Soln nasal spray Commonly known as: OCEAN Place 1 spray into both nostrils every 6 (six) hours as needed for congestion.        Follow-up Information     Grayce Sessions, NP Follow up in 1 week(s).   Specialty: Internal Medicine Contact information: [address] [place] [address] [phone]                No Known Allergies  Consultations: General Surgery   Procedures/Studies: DG Abd 1 View Result Date: 10/12/2023 CLINICAL DATA:  [id number] Ileus [facility]) [id number] EXAM: ABDOMEN - 1 VIEW COMPARISON:  10/11/2023 FINDINGS: Progressive dilation of multiple small bowel loops throughout the abdomen now measuring up to 7.0 cm in diameter (previously approximately 4.4 cm). No gross free intraperitoneal air. IMPRESSION: Progressive dilation of multiple small bowel loops throughout the  abdomen, concerning for worsening small bowel obstruction. Electronically Signed   By: Duanne Guess D.O.   On: 10/12/2023 10:23   CT ABDOMEN PELVIS W CONTRAST Result Date: 10/11/2023 CLINICAL DATA:  Bowel obstruction suspected. EXAM: CT ABDOMEN AND PELVIS WITH CONTRAST TECHNIQUE: Multidetector CT imaging of the abdomen and pelvis was performed using the standard protocol following bolus administration of intravenous contrast. RADIATION DOSE REDUCTION: This exam was performed according to the departmental dose-optimization program which includes automated exposure control, adjustment of the mA and/or kV according to patient size and/or use of iterative reconstruction technique. CONTRAST:  75mL OMNIPAQUE  IOHEXOL 350 MG/ML SOLN COMPARISON:  04/20/2016 FINDINGS: Lower chest: Small right pleural effusion. Hepatobiliary: No acute abnormality. Pancreas: Unremarkable. Spleen: Unremarkable. Adrenals/Urinary Tract: Stable adrenal glands. No urinary calculi or hydronephrosis. Asymmetric right bladder wall thickening and mucosal hyperenhancement. Stomach/Bowel: Enteric tube tip in the stomach. Stomach is within normal limits. Diffuse small bowel dilation with air-fluid levels. No single discrete transition point. There are multiple areas of small-bowel narrowing with associated mild wall thickening. For example in the central abdomen on series 6/image 88, in the left hemiabdomen on series 6/image 73, and in the left hemiabdomen on series 3/image 53. The distal ileum is decompressed. Normal caliber colon containing stool. Normal appendix. Vascular/Lymphatic: No significant vascular findings are present. No enlarged abdominal or pelvic lymph nodes. Reproductive: No acute abnormality. Other: No free intraperitoneal fluid or air. Musculoskeletal: No acute fracture. IMPRESSION: 1. Diffuse small bowel dilation with air-fluid levels. No single discrete transition point. There are multiple areas of small-bowel narrowing with associated mild wall thickening. Findings are favored to represent ileus secondary to enteritis. 2. Asymmetric right bladder wall thickening and mucosal hyperenhancement. Correlate with cystoscopy to exclude malignancy. 3. Small right pleural effusion. Electronically Signed   By: Minerva Fester M.D.   On: 10/11/2023 02:54    Subjective: Patient was seen and examined at bedside. Overnight events noted.   Abdominal pain has resolved,  able to pass flatus and bowel movement.   Patient wants to be discharged.  He has tolerated  soft bland diet.  Discharge Exam: Vitals:   10/14/23 0516 10/14/23 0753  BP: 136/83 122/65  Pulse: 60 69  Resp: 18 16  Temp: 98.1 F (36.7 C) 98.4 F (36.9 C)  SpO2: 94% 96%    Vitals:   10/13/23 1605 10/13/23 2050 10/14/23 0516 10/14/23 0753  BP: 130/78 (!) 140/85 136/83 122/65  Pulse: 69 (!) 55 60 69  Resp: 16 18 18 16   Temp: 97.9 F (36.6 C) 98.4 F (36.9 C) 98.1 F (36.7 C) 98.4 F (36.9 C)  TempSrc: Oral Oral Oral Oral  SpO2: 96% 99% 94% 96%  Weight:      Height:        General: Pt is alert, awake, not in acute distress Cardiovascular: RRR, S1/S2 +, no rubs, no gallops Respiratory: CTA bilaterally, no wheezing, no rhonchi Abdominal: Soft, NT, ND, bowel sounds + Extremities: no edema, no cyanosis    The results of significant diagnostics from this hospitalization (including imaging, microbiology, ancillary and laboratory) are listed below for reference.     Microbiology: No results found for this or any previous visit (from the past 240 hours).   Labs: BNP (last 3 results) No results for input(s): "BNP" in the last 8760 hours. Basic Metabolic Panel: Recent Labs  Lab 10/11/23 0104 10/12/23 0334 10/13/23 0505 10/14/23 0449  NA 137 138 131* 134*  K 3.8 4.0 3.3* 3.5  CL 103 102  98 102  CO2 21* 26 24 22   GLUCOSE 146* 105* 91 83  BUN 26* 30* 15 10  CREATININE 0.78 0.85 0.54* 0.61  CALCIUM 9.0 8.4* 8.0* 8.2*  MG  --   --  2.3 2.1  PHOS  --   --  2.6 2.4*   Liver Function Tests: Recent Labs  Lab 10/11/23 0104  AST 22  ALT 13  ALKPHOS 166*  BILITOT 0.7  PROT 8.1  ALBUMIN 3.5   Recent Labs  Lab 10/11/23 0104  LIPASE 18   No results for input(s): "AMMONIA" in the last 168 hours. CBC: Recent Labs  Lab 10/11/23 0104 10/13/23 0505 10/14/23 0449  WBC 6.3 8.0 8.9  NEUTROABS 4.6  --   --   HGB 16.5 13.6 13.1  HCT 50.2 41.4 40.3  MCV 91.3 92.8 91.4  PLT 296 241 243   Cardiac Enzymes: No results for input(s): "CKTOTAL", "CKMB", "CKMBINDEX", "TROPONINI" in the last 168 hours. BNP: Invalid input(s): "POCBNP" CBG: No results for input(s): "GLUCAP" in the last 168 hours. D-Dimer No results for input(s): "DDIMER" in  the last 72 hours. Hgb A1c No results for input(s): "HGBA1C" in the last 72 hours. Lipid Profile No results for input(s): "CHOL", "HDL", "LDLCALC", "TRIG", "CHOLHDL", "LDLDIRECT" in the last 72 hours. Thyroid function studies No results for input(s): "TSH", "T4TOTAL", "T3FREE", "THYROIDAB" in the last 72 hours.  Invalid input(s): "FREET3" Anemia work up No results for input(s): "VITAMINB12", "FOLATE", "FERRITIN", "TIBC", "IRON", "RETICCTPCT" in the last 72 hours. Urinalysis    Component Value Date/Time   COLORURINE YELLOW 01/07/2021 1627   APPEARANCEUR HAZY (A) 01/07/2021 1627   LABSPEC 1.013 01/07/2021 1627   PHURINE 6.0 01/07/2021 1627   GLUCOSEU NEGATIVE 01/07/2021 1627   HGBUR NEGATIVE 01/07/2021 1627   BILIRUBINUR NEGATIVE 01/07/2021 1627   KETONESUR NEGATIVE 01/07/2021 1627   PROTEINUR NEGATIVE 01/07/2021 1627   UROBILINOGEN 1.0 03/31/2014 1307   NITRITE NEGATIVE 01/07/2021 1627   LEUKOCYTESUR LARGE (A) 01/07/2021 1627   Sepsis Labs Recent Labs  Lab 10/11/23 0104 10/13/23 0505 10/14/23 0449  WBC 6.3 8.0 8.9   Microbiology No results found for this or any previous visit (from the past 240 hours).   Time coordinating discharge: Over 30 minutes  SIGNED:   Willeen Niece, MD  [facility] 10/14/2023, 12:00 PM Pager   If 7PM-7AM, please contact night-coverage

## 2023-10-14 NOTE — TOC Transition Note (Signed)
Transition of Care [facility]) - Discharge Note   Patient Details  Name: Jared Preston MRN: [id number] Date of Birth: 02/23/1961  [facility] CM/SW Contact:  Deatra Robinson, [place] Phone Number: 10/14/2023, 12:27 PM   Clinical Narrative:  Pt for dc back to [place] today. Spoke to [place] in admissions who confirmed they are prepared to admit pt to room 313B. Pt's brother Reggie aware of dc and reports agreeable. RN provided with number for report and [facility] arranged for transport. SW signing off at dc.    Dellie Burns, MSW, LCSW [phone] (coverage)       Final next level of care: Skilled Nursing Facility Barriers to Discharge: Barriers Resolved   Patient Goals and CMS Choice            Discharge Placement              Patient chooses bed at: [facility] Patient to be transferred to facility by: [facility] Name of family member notified: Reggie/Brother Patient and family notified of of transfer: 10/14/23  Discharge Plan and Services Additional resources added to the After Visit Summary for   In-house Referral: Clinical Social Work   Post Acute Care Choice: Skilled Nursing Facility                               Social Drivers of Health (SDOH) Interventions SDOH Screenings   Food Insecurity: No Food Insecurity (10/11/2023)  Housing: Low Risk  (10/11/2023)  Transportation Needs: No Transportation Needs (10/11/2023)  Utilities: Not At Risk (10/11/2023)  Depression (PHQ2-9): Low Risk  (09/29/2020)  Tobacco Use: High Risk (10/11/2023)     Readmission Risk Interventions     No data to display
